# Patient Record
Sex: Male | Born: 1960 | Race: White | Hispanic: No | Marital: Married | Smoking: Never smoker
Health system: Southern US, Community
[De-identification: ages and names within clinical notes are randomized; demographics above are authoritative.]

## PROBLEM LIST (undated history)

## (undated) DIAGNOSIS — K7290 Hepatic failure, unspecified without coma: Secondary | ICD-10-CM

## (undated) DIAGNOSIS — N19 Unspecified kidney failure: Secondary | ICD-10-CM

## (undated) DIAGNOSIS — I10 Essential (primary) hypertension: Secondary | ICD-10-CM

## (undated) HISTORY — PX: OTHER SURGICAL HISTORY: SHX169

---

## 2007-12-26 ENCOUNTER — Observation Stay: Payer: Self-pay | Admitting: Internal Medicine

## 2013-03-09 ENCOUNTER — Other Ambulatory Visit: Payer: Self-pay | Admitting: Bariatrics

## 2013-03-09 DIAGNOSIS — I10 Essential (primary) hypertension: Secondary | ICD-10-CM

## 2013-03-14 ENCOUNTER — Other Ambulatory Visit: Payer: Self-pay

## 2016-12-16 ENCOUNTER — Encounter: Payer: Self-pay | Admitting: Intensive Care

## 2016-12-16 ENCOUNTER — Emergency Department: Payer: BC Managed Care – PPO

## 2016-12-16 ENCOUNTER — Observation Stay
Admission: EM | Admit: 2016-12-16 | Discharge: 2016-12-17 | Disposition: A | Discharge status: Home / Self Care | Payer: BC Managed Care – PPO | Attending: Internal Medicine | Admitting: Internal Medicine

## 2016-12-16 DIAGNOSIS — I10 Essential (primary) hypertension: Secondary | ICD-10-CM | POA: Insufficient documentation

## 2016-12-16 DIAGNOSIS — Z9884 Bariatric surgery status: Secondary | ICD-10-CM | POA: Insufficient documentation

## 2016-12-16 DIAGNOSIS — R079 Chest pain, unspecified: Secondary | ICD-10-CM | POA: Diagnosis present

## 2016-12-16 DIAGNOSIS — R0789 Other chest pain: Principal | ICD-10-CM | POA: Insufficient documentation

## 2016-12-16 DIAGNOSIS — R200 Anesthesia of skin: Secondary | ICD-10-CM | POA: Insufficient documentation

## 2016-12-16 DIAGNOSIS — R110 Nausea: Secondary | ICD-10-CM | POA: Diagnosis not present

## 2016-12-16 DIAGNOSIS — R42 Dizziness and giddiness: Secondary | ICD-10-CM | POA: Insufficient documentation

## 2016-12-16 HISTORY — DX: Essential (primary) hypertension: I10

## 2016-12-16 LAB — BASIC METABOLIC PANEL
Anion gap: 11 (ref 5–15)
BUN: 13 mg/dL (ref 6–20)
CO2: 25 mmol/L (ref 22–32)
CREATININE: 0.9 mg/dL (ref 0.61–1.24)
Calcium: 9.1 mg/dL (ref 8.9–10.3)
Chloride: 103 mmol/L (ref 101–111)
GFR calc Af Amer: >60 mL/min (ref >60)
Glucose, Bld: 139 mg/dL — ABNORMAL HIGH (ref 65–99)
Potassium: 3.6 mmol/L (ref 3.5–5.1)
SODIUM: 139 mmol/L (ref 135–145)

## 2016-12-16 LAB — CBC
HCT: 45.1 % (ref 40.0–52.0)
Hemoglobin: 15.4 g/dL (ref 13.0–18.0)
MCH: 33 pg (ref 26.0–34.0)
MCHC: 34.1 g/dL (ref 32.0–36.0)
MCV: 97 fL (ref 80.0–100.0)
PLATELETS: 199 10*3/uL (ref 150–440)
RBC: 4.65 MIL/uL (ref 4.40–5.90)
RDW: 13.6 % (ref 11.5–14.5)
WBC: 10.6 10*3/uL (ref 3.8–10.6)

## 2016-12-16 LAB — TROPONIN I: Troponin I: <0.03 ng/mL (ref <0.03)

## 2016-12-16 MED ORDER — ATORVASTATIN CALCIUM 20 MG PO TABS
40.0000 mg | ORAL_TABLET | Freq: Every day | ORAL | Status: DC
  Administered 2016-12-16: 40 mg via ORAL
  Filled 2016-12-16: qty 2

## 2016-12-16 MED ORDER — ONDANSETRON HCL 4 MG/2ML IJ SOLN: 4.0000 mg | Freq: Four times a day (QID) | INTRAMUSCULAR | Status: DC | PRN

## 2016-12-16 MED ORDER — MORPHINE SULFATE (PF) 2 MG/ML IV SOLN: 2.0000 mg | INTRAVENOUS | Status: DC | PRN

## 2016-12-16 MED ORDER — ACETAMINOPHEN 325 MG PO TABS: 650.0000 mg | ORAL_TABLET | ORAL | Status: DC | PRN

## 2016-12-16 MED ORDER — ASPIRIN 81 MG PO CHEW
324.0000 mg | CHEWABLE_TABLET | Freq: Once | ORAL | Status: AC
  Administered 2016-12-16: 324 mg via ORAL
  Filled 2016-12-16: qty 4

## 2016-12-16 MED ORDER — NITROGLYCERIN 0.4 MG SL SUBL
0.4000 mg | SUBLINGUAL_TABLET | SUBLINGUAL | Status: DC | PRN
  Administered 2016-12-16: 0.4 mg via SUBLINGUAL
  Filled 2016-12-16: qty 1

## 2016-12-16 MED ORDER — FLUTICASONE PROPIONATE 50 MCG/ACT NA SUSP
2.0000 | Freq: Every day | NASAL | Status: DC | PRN
  Filled 2016-12-16: qty 16

## 2016-12-16 MED ORDER — METOPROLOL TARTRATE 50 MG PO TABS
50.0000 mg | ORAL_TABLET | Freq: Two times a day (BID) | ORAL | Status: DC
  Administered 2016-12-16 – 2016-12-17 (×2): 50 mg via ORAL
  Filled 2016-12-16 (×2): qty 1

## 2016-12-16 MED ORDER — ASPIRIN EC 325 MG PO TBEC
325.0000 mg | DELAYED_RELEASE_TABLET | Freq: Every day | ORAL | Status: DC
  Administered 2016-12-17: 325 mg via ORAL
  Filled 2016-12-16: qty 1

## 2016-12-16 MED ORDER — SODIUM CHLORIDE 0.9 % IV BOLUS (SEPSIS)
500.0000 mL | Freq: Once | INTRAVENOUS | Status: AC
  Administered 2016-12-16: 500 mL via INTRAVENOUS

## 2016-12-16 MED ORDER — LORATADINE 10 MG PO TABS
10.0000 mg | ORAL_TABLET | Freq: Every day | ORAL | Status: DC
  Filled 2016-12-16: qty 1

## 2016-12-16 MED ORDER — GI COCKTAIL ~~LOC~~
30.0000 mL | Freq: Four times a day (QID) | ORAL | Status: DC | PRN
  Filled 2016-12-16: qty 30

## 2016-12-16 MED ORDER — LEVOCETIRIZINE DIHYDROCHLORIDE 5 MG PO TABS: 5.0000 mg | ORAL_TABLET | Freq: Every day | ORAL | Status: DC

## 2016-12-16 MED ORDER — ENOXAPARIN SODIUM 40 MG/0.4ML ~~LOC~~ SOLN
40.0000 mg | SUBCUTANEOUS | Status: DC
  Administered 2016-12-16: 40 mg via SUBCUTANEOUS
  Filled 2016-12-16: qty 0.4

## 2016-12-16 NOTE — Plan of Care (Signed)
Problem: Cardiac: Goal: Ability to achieve and maintain adequate cardiovascular perfusion will improve Stress test ordered for tomorrow

## 2016-12-16 NOTE — Plan of Care (Signed)
Problem: Safety: Goal: Ability to remain free from injury will improve Outcome: Progressing Pt moderate fall risk

## 2016-12-16 NOTE — ED Notes (Signed)
Dr. Chen at bedside.

## 2016-12-16 NOTE — Plan of Care (Signed)
Problem: Education: Goal: Knowledge of [facility] Education information/materials will improve Outcome: Progressing [place] packet given and reviewed with pt/ informed how to use call bell and phone/ white board updated with plan of care

## 2016-12-16 NOTE — H&P (Addendum)
[facility] - [place] at [facility]   PATIENT NAME: Melvin Stone    MR#:  [id number]  DATE OF BIRTH:  20-Feb-1961  DATE OF ADMISSION:  12/16/2016  PRIMARY CARE PHYSICIAN: Lauro Regulus., MD   REQUESTING/REFERRING PHYSICIAN: Nita Sickle, MD  CHIEF COMPLAINT:   Chief Complaint  Patient presents with  . Chest Pain   Chest pain today HISTORY OF PRESENT ILLNESS:  Rece Stone  is a 56 y.o. male with a known history of Hypertension. The patient presents to the ED with chest pain today. He was driving home from work when he developed chest pressure in the center of the chest, which is constant 5-6 out of 10, but associated with nausea and lightheadedness. He felt left arm numbness. He said he had a heart catheterization in [place] 10 years ago, which was unremarkable. His first troponin level is normal. He has abnormal EKG, but he said his previous EKG was abnormal.  He was given aspirin and nitroglycerin in the ED, chest pain resolved. He reported that he has stress from work recently.  PAST MEDICAL HISTORY:   Past Medical History:  Diagnosis Date  . Hypertension     PAST SURGICAL HISTORY:   Past Surgical History:  Procedure Laterality Date  . gastric bypass      SOCIAL HISTORY:   Social History  Substance Use Topics  . Smoking status: Never Smoker  . Smokeless tobacco: Never Used  . Alcohol use 2.4 oz/week    4 Cans of beer per week    FAMILY HISTORY:   Family History  Problem Relation Age of Onset  . Hypertension Mother     DRUG ALLERGIES:  No Known Allergies  REVIEW OF SYSTEMS:   Review of Systems  Constitutional: Negative for chills, fever and malaise/fatigue.  HENT: Negative for congestion and sore throat.   Eyes: Negative for blurred vision and double vision.  Respiratory: Negative for cough, shortness of breath and stridor.   Cardiovascular: Positive for chest pain. Negative for palpitations and leg swelling.    Gastrointestinal: Negative for abdominal pain, blood in stool, diarrhea, melena, nausea and vomiting.  Genitourinary: Negative for dysuria and hematuria.  Musculoskeletal: Negative for back pain.  Skin: Negative for itching and rash.  Neurological: Negative for dizziness, focal weakness, loss of consciousness, weakness and headaches.  Psychiatric/Behavioral: Negative for depression. The patient is nervous/anxious.     MEDICATIONS AT HOME:   Prior to Admission medications   Medication Sig Start Date End Date Taking? Authorizing Provider  fluticasone (FLONASE) 50 MCG/ACT nasal spray Place 2 sprays into both nostrils daily as needed. 10/19/16  Yes Historical Provider, MD  levocetirizine (XYZAL) 5 MG tablet Take 5 mg by mouth daily. 12/02/16  Yes Historical Provider, MD  metoprolol (LOPRESSOR) 50 MG tablet Take 50 mg by mouth 2 (two) times daily.  11/09/16  Yes Historical Provider, MD      VITAL SIGNS:  Blood pressure (!) 148/81, pulse 80, resp. rate 15, SpO2 97 %.  PHYSICAL EXAMINATION:  Physical Exam  GENERAL:  56 y.o.-year-old patient lying in the bed with no acute distress.  EYES: Pupils equal, round, reactive to light and accommodation. No scleral icterus. Extraocular muscles intact.  HEENT: Head atraumatic, normocephalic. Oropharynx and nasopharynx clear.  NECK:  Supple, no jugular venous distention. No thyroid enlargement, no tenderness.  LUNGS: Normal breath sounds bilaterally, no wheezing, rales,rhonchi or crepitation. No use of accessory muscles of respiration.  CARDIOVASCULAR: S1, S2 normal. No murmurs, rubs, or gallops.  ABDOMEN: Soft, nontender, nondistended. Bowel sounds present. No organomegaly or mass.  EXTREMITIES: No pedal edema, cyanosis, or clubbing.  NEUROLOGIC: Cranial nerves II through XII are intact. Muscle strength 5/5 in all extremities. Sensation intact. Gait not checked.  PSYCHIATRIC: The patient is alert and oriented x 3.  SKIN: No obvious rash, lesion, or  ulcer.   LABORATORY PANEL:   CBC  Recent Labs Lab 12/16/16 1432  WBC 10.6  HGB 15.4  HCT 45.1  PLT 199   ------------------------------------------------------------------------------------------------------------------  Chemistries   Recent Labs Lab 12/16/16 1432  NA 139  K 3.6  CL 103  CO2 25  GLUCOSE 139*  BUN 13  CREATININE 0.90  CALCIUM 9.1   ------------------------------------------------------------------------------------------------------------------  Cardiac Enzymes  Recent Labs Lab 12/16/16 1432  TROPONINI <0.03   ------------------------------------------------------------------------------------------------------------------  RADIOLOGY:  Dg Chest 2 View  Result Date: 12/16/2016 CLINICAL DATA:  Chest pressure while driving. Abnormal tension sensation in the left arm. History of hypertension. Nonsmoker. EXAM: CHEST  2 VIEW COMPARISON:  Chest x-ray of Dec 27, 2007 FINDINGS: The lungs are adequately inflated. The interstitial markings are coarse but this is not entirely new. The heart is normal in size. The pulmonary vascularity is not engorged. There is calcification in the wall of the aortic arch. There is no pleural effusion. The bony thorax exhibits no acute abnormality. IMPRESSION: No pneumonia nor CHF. Mild interstitial prominence bilaterally likely reflects chronic bronchitic change. Thoracic aortic atherosclerosis. Electronically Signed   By: David  Swazil[place]Jordan M.D.   On: 12/16/2016 15:41      IMPRESSION AND PLAN:   Chest pain, rule out ACS. The patient will be placed for observation. Continued telemetry monitor, aspirin, Lopressor and Lipitor, follow-up troponin level and stress test in a.m.  Hypertension. Continue Lopressor.  All the records are reviewed and case discussed with ED provider. Management plans discussed with the patient, his wife and they are in agreement.  CODE STATUS: Full code  TOTAL TIME TAKING CARE OF THIS PATIENT: 46  minutes.    Shaune Pollackhen, Adana Marik M.D on 12/16/2016 at 4:31 PM  Between 7am to 6pm - Pager - [phone]  After 6pm go to [email or web address] - Social research officer, [email or web address] [email or web address]  [facility] [place] [facility]  Office  [phone]  CC: Primary care physician; Lauro RegulusANDERSON,MARSHALL W., MD   Note: This dictation was prepared with Dragon dictation along with smaller phrase technology. Any transcriptional errors that result from this process are unintentional.

## 2016-12-16 NOTE — ED Notes (Signed)
Pt family brought pt food. Dr. Imogene Burnhen told pt he could eat until midnight.

## 2016-12-16 NOTE — ED Triage Notes (Signed)
Patient reports he was driving and started feeling chest pressure. Upon arrival to ER patient reports tension in his L arm. A&O x4

## 2016-12-16 NOTE — ED Provider Notes (Signed)
[facility] Emergency Department Provider Note  ____________________________________________  Time seen: Approximately 3:30 PM  I have reviewed the triage vital signs and the nursing notes.   HISTORY  Chief Complaint Chest Pain   HPI Melvin Stone is a 56 y.o. male with a history of hypertension and obesity who presents for evaluation of chest pain. Patient reports that he was driving home from work when he developed pressure in the center of his chest, constant, moderate intensity, non radiating, associated with nausea and lightheadedness, and radiating to the L arm. The pressure is currently 1/10. No nausea, diaphoresis, SOB, or vomiting. No prior episodes of CP. Patient reports that he walks 3-4 miles a day with no chest discomfort. He reports that he [place]. He denies personal or family history of ischemic heart disease. He is not a smoker. Pain does not radiate to the back, is not pleuritic in nature, no neurological deficits.  Past Medical History:  Diagnosis Date  . Hypertension     There are no active problems to display for this patient.   Past Surgical History:  Procedure Laterality Date  . gastric bypass      Prior to Admission medications   Medication Sig Start Date End Date Taking? Authorizing Provider  fluticasone (FLONASE) 50 MCG/ACT nasal spray Place 2 sprays into both nostrils daily as needed. 10/19/16  Yes Historical Provider, MD  levocetirizine (XYZAL) 5 MG tablet Take 5 mg by mouth daily. 12/02/16   Historical Provider, MD  metoprolol (LOPRESSOR) 50 MG tablet Take 50 mg by mouth daily. 11/09/16   Historical Provider, MD    Allergies Patient has no known allergies.  History reviewed. No pertinent family history.  Social History Social History  Substance Use Topics  . Smoking status: Never Smoker  . Smokeless tobacco: Never Used  . Alcohol use 2.4 oz/week    4  Cans of beer per week    Review of Systems  Constitutional: Negative for fever. Eyes: Negative for visual changes. ENT: Negative for sore throat. Neck: No neck pain  Cardiovascular: + chest pain. Respiratory: Negative for shortness of breath. Gastrointestinal: Negative for abdominal pain, vomiting or diarrhea. Genitourinary: Negative for dysuria. Musculoskeletal: Negative for back pain. Skin: Negative for rash. Neurological: Negative for headaches, weakness or numbness. Psych: No SI or HI  ____________________________________________   PHYSICAL EXAM:  VITAL SIGNS: Vitals:   12/16/16 1500 12/16/16 1536  BP: (!) 152/93 (!) 160/88  Pulse: 91 82  Resp:  15    Constitutional: Alert and oriented. Well appearing and in no apparent distress. HEENT:      Head: Normocephalic and atraumatic.         Eyes: Conjunctivae are normal. Sclera is non-icteric. EOMI. PERRL      Mouth/Throat: Mucous membranes are moist.       Neck: Supple with no signs of meningismus. Cardiovascular: Regular rate and rhythm. No murmurs, gallops, or rubs. 2+ symmetrical distal pulses are present in all extremities. No JVD. Respiratory: Normal respiratory effort. Lungs are clear to auscultation bilaterally. No wheezes, crackles, or rhonchi.  Gastrointestinal: Soft, non tender, and non distended with positive bowel sounds. No rebound or guarding. Genitourinary: No CVA tenderness. Musculoskeletal: Nontender with normal range of motion in all extremities. No edema, cyanosis, or erythema of extremities. Neurologic: Normal speech and language. Face is symmetric. Moving all extremities. No gross focal neurologic deficits are appreciated. Skin: Skin is warm, dry and  intact. No rash noted. Psychiatric: Mood and affect are normal. Speech and behavior are normal.  ____________________________________________   LABS (all labs ordered are listed, but only abnormal results are displayed)  Labs Reviewed  BASIC  METABOLIC PANEL - Abnormal; Notable for the following:       Result Value   Glucose, Bld 139 (*)    All other components within normal limits  CBC  TROPONIN I   ____________________________________________  EKG  ED ECG REPORT I, Nita Sickle, the attending physician, personally viewed and interpreted this ECG.  14:34 - normal sinus rhythm, rate of 99, normal intervals, normal axis, no ST elevations or depressions, Q waves on inferior leads.  15:14 - NSR, rate 86, normal intervals, normal axis, no ST elevations or depressions, T-wave inversion in 3 and aVF which are new when compared to the one earlier today. ____________________________________________  RADIOLOGY  CXR: No pneumonia nor CHF. Mild interstitial prominence bilaterally likely reflects chronic bronchitic change.  Thoracic aortic atherosclerosis.  ____________________________________________   PROCEDURES  Procedure(s) performed: None Procedures Critical Care performed:  None ____________________________________________   INITIAL IMPRESSION / ASSESSMENT AND PLAN / ED COURSE  56 y.o. male with a history of hypertension and obesity who presents for evaluation of chest pain. Patient's history is concerning for ACS, Heart score of 5, EKG with dynamic changes but no STEMI. Troponin x 1 negative. Pain resolved with one sublingual nitro. Will admit to Hospitalist.     Pertinent labs & imaging results that were available during my care of the patient were reviewed by me and considered in my medical decision making (see chart for details).    ____________________________________________   FINAL CLINICAL IMPRESSION(S) / ED DIAGNOSES  Final diagnoses:  Chest pain, unspecified type      NEW MEDICATIONS STARTED DURING THIS VISIT:  New Prescriptions   No medications on file     Note:  This document was prepared using Dragon voice recognition software and may include unintentional dictation errors.      Nita Sickle, MD 12/16/16 [phone]

## 2016-12-17 ENCOUNTER — Other Ambulatory Visit: Payer: BC Managed Care – PPO

## 2016-12-17 LAB — LIPID PANEL
Cholesterol: 172 mg/dL (ref 0–200)
HDL: 75 mg/dL (ref >40)
LDL CALC: 85 mg/dL (ref 0–99)
Total CHOL/HDL Ratio: 2.3 RATIO
Triglycerides: 60 mg/dL (ref <150)
VLDL: 12 mg/dL (ref 0–40)

## 2016-12-17 LAB — HIV ANTIBODY (ROUTINE TESTING W REFLEX): HIV Screen 4th Generation wRfx: NONREACTIVE

## 2016-12-17 NOTE — Progress Notes (Signed)
[facility] [place] at [facility]        Melvin Stone was admitted to the Hospital on 12/16/2016 and Discharged  12/17/2016 and should be excused from work/school   for 2  days starting 12/16/2016 , may return to work/school without any restrictions.  Call [name] MD with questions.  [name] M.D on 12/17/2016,at 10:27 AM  [facility] - [place] at [facility]    Office  [phone]

## 2016-12-17 NOTE — Care Management (Signed)
No discharge needs identified by members of the care team 

## 2016-12-17 NOTE — Discharge Summary (Addendum)
[facility] - [place] at [facility]   PATIENT NAME: Melvin Stone    MR#:  [id number]  DATE OF BIRTH:  Jan 04, 1961  DATE OF ADMISSION:  12/16/2016 ADMITTING PHYSICIAN: Shaune Pollack, MD  DATE OF DISCHARGE: 12/17/2016  PRIMARY CARE PHYSICIAN: Lauro Regulus., MD    ADMISSION DIAGNOSIS:  Chest pain [R07.9] Chest pain, unspecified type [R07.9]  DISCHARGE DIAGNOSIS:  Active Problems:   Chest pain   SECONDARY DIAGNOSIS:   Past Medical History:  Diagnosis Date  . Hypertension     HOSPITAL COURSE:   56 year old male with hypertension who presents with atypical chest pain.  1. Atypical chest pain: Patient had cardiac enzymes which were normal. Telemetry showed no irregular heart rhythm. Stress test was normal.  2. Essential hypertension: Patient's blood pressure was slightly elevated. Patient will need outpatient follow-up for blood pressure.   DISCHARGE CONDITIONS AND DIET:   Stable for discharge on heart healthy diet  CONSULTS OBTAINED:    DRUG ALLERGIES:  No Known Allergies  DISCHARGE MEDICATIONS:   Current Discharge Medication List    CONTINUE these medications which have NOT CHANGED   Details  fluticasone (FLONASE) 50 MCG/ACT nasal spray Place 2 sprays into both nostrils daily as needed.    levocetirizine (XYZAL) 5 MG tablet Take 5 mg by mouth daily.    metoprolol (LOPRESSOR) 50 MG tablet Take 50 mg by mouth 2 (two) times daily.     Multiple Vitamin (MULTIVITAMIN WITH MINERALS) TABS tablet Take 1 tablet by mouth daily.          Today   CHIEF COMPLAINT:   No acute events overnight. Patient reports no chest pain tremors   VITAL SIGNS:  Blood pressure (!) 169/84, pulse 60, temperature 97.6 F (36.4 C), temperature source Oral, resp. rate 14, weight 95 kg (209 lb 8 oz), SpO2 97 %.   REVIEW OF SYSTEMS:  Review of Systems  Constitutional: Negative.  Negative for chills, fever and malaise/fatigue.  HENT: Negative.  Negative for  ear discharge, ear pain, hearing loss, nosebleeds and sore throat.   Eyes: Negative.  Negative for blurred vision and pain.  Respiratory: Negative.  Negative for cough, hemoptysis, shortness of breath and wheezing.   Cardiovascular: Negative.  Negative for chest pain, palpitations and leg swelling.  Gastrointestinal: Negative.  Negative for abdominal pain, blood in stool, diarrhea, nausea and vomiting.  Genitourinary: Negative.  Negative for dysuria.  Musculoskeletal: Negative.  Negative for back pain.  Skin: Negative.   Neurological: Negative for dizziness, tremors, speech change, focal weakness, seizures and headaches.  Endo/Heme/Allergies: Negative.  Does not bruise/bleed easily.  Psychiatric/Behavioral: Negative.  Negative for depression, hallucinations and suicidal ideas.     PHYSICAL EXAMINATION:  GENERAL:  56 y.o.-year-old patient lying in the bed with no acute distress.  NECK:  Supple, no jugular venous distention. No thyroid enlargement, no tenderness.  LUNGS: Normal breath sounds bilaterally, no wheezing, rales,rhonchi  No use of accessory muscles of respiration.  CARDIOVASCULAR: S1, S2 normal. No murmurs, rubs, or gallops.  ABDOMEN: Soft, non-tender, non-distended. Bowel sounds present. No organomegaly or mass.  EXTREMITIES: No pedal edema, cyanosis, or clubbing.  PSYCHIATRIC: The patient is alert and oriented x 3.  SKIN: No obvious rash, lesion, or ulcer.   DATA REVIEW:   CBC  Recent Labs Lab 12/16/16 1432  WBC 10.6  HGB 15.4  HCT 45.1  PLT 199    Chemistries   Recent Labs Lab 12/16/16 1432  NA 139  K 3.6  CL 103  CO2 25  GLUCOSE 139*  BUN 13  CREATININE 0.90  CALCIUM 9.1    Cardiac Enzymes  Recent Labs Lab 12/16/16 1432 12/16/16 1827  TROPONINI <0.03 <0.03    Microbiology Results  @[email or web address] @  RADIOLOGY:  Dg Chest 2 View  Result Date: 12/16/2016 CLINICAL DATA:  Chest pressure while driving. Abnormal tension sensation in the left arm.  History of hypertension. Nonsmoker. EXAM: CHEST  2 VIEW COMPARISON:  Chest x-ray of Dec 27, 2007 FINDINGS: The lungs are adequately inflated. The interstitial markings are coarse but this is not entirely new. The heart is normal in size. The pulmonary vascularity is not engorged. There is calcification in the wall of the aortic arch. There is no pleural effusion. The bony thorax exhibits no acute abnormality. IMPRESSION: No pneumonia nor CHF. Mild interstitial prominence bilaterally likely reflects chronic bronchitic change. Thoracic aortic atherosclerosis. Electronically Signed   By: David  Swazil[place]Jordan M.D.   On: 12/16/2016 15:41      Current Discharge Medication List    CONTINUE these medications which have NOT CHANGED   Details  fluticasone (FLONASE) 50 MCG/ACT nasal spray Place 2 sprays into both nostrils daily as needed.    levocetirizine (XYZAL) 5 MG tablet Take 5 mg by mouth daily.    metoprolol (LOPRESSOR) 50 MG tablet Take 50 mg by mouth 2 (two) times daily.     Multiple Vitamin (MULTIVITAMIN WITH MINERALS) TABS tablet Take 1 tablet by mouth daily.           Management plans discussed with the patient and he is in agreement. Stable for discharge home  Patient should follow up with pcp  CODE STATUS:     Code Status Orders        Start     Ordered   12/16/16 1639  Full code  Continuous     12/16/16 1638    Code Status History    Date Active Date Inactive Code Status Order ID Comments User Context   This patient has a current code status but no historical code status.      TOTAL TIME TAKING CARE OF THIS PATIENT: 37 minutes.    Note: This dictation was prepared with Dragon dictation along with smaller phrase technology. Any transcriptional errors that result from this process are unintentional.  Kamori Barbier M.D on 12/17/2016 at 10:25 AM  Between 7am to 6pm - Pager - [phone] After 6pm go to [email or web address] - password [email or web address]  [facility]   Office  [phone]  CC: Primary care physician; Lauro RegulusANDERSON,MARSHALL W., MD

## 2016-12-17 NOTE — Plan of Care (Signed)
Problem: Health Behavior/Discharge Planning: Goal: Ability to manage health-related needs will improve Outcome: Completed/Met Date Met: 12/17/16 Discharge instructions and meds and followup

## 2019-01-19 ENCOUNTER — Other Ambulatory Visit: Payer: Self-pay | Admitting: Internal Medicine

## 2019-01-19 ENCOUNTER — Ambulatory Visit
Admission: RE | Admit: 2019-01-19 | Discharge: 2019-01-19 | Disposition: A | Discharge status: Home / Self Care | Payer: BC Managed Care – PPO | Source: Ambulatory Visit | Attending: Internal Medicine | Admitting: Internal Medicine

## 2019-01-19 ENCOUNTER — Other Ambulatory Visit: Payer: Self-pay

## 2019-01-19 DIAGNOSIS — R270 Ataxia, unspecified: Secondary | ICD-10-CM

## 2019-01-22 ENCOUNTER — Other Ambulatory Visit: Payer: Self-pay | Admitting: Internal Medicine

## 2019-01-22 DIAGNOSIS — R7989 Other specified abnormal findings of blood chemistry: Secondary | ICD-10-CM

## 2019-01-23 ENCOUNTER — Ambulatory Visit: Payer: BC Managed Care – PPO

## 2019-01-26 ENCOUNTER — Other Ambulatory Visit: Payer: Self-pay | Admitting: Neurology

## 2019-01-26 ENCOUNTER — Other Ambulatory Visit (HOSPITAL_COMMUNITY): Payer: Self-pay | Admitting: Neurology

## 2019-01-26 DIAGNOSIS — R2689 Other abnormalities of gait and mobility: Secondary | ICD-10-CM

## 2019-01-26 DIAGNOSIS — R251 Tremor, unspecified: Secondary | ICD-10-CM

## 2019-01-30 ENCOUNTER — Emergency Department: Payer: BC Managed Care – PPO

## 2019-01-30 ENCOUNTER — Emergency Department
Admission: EM | Admit: 2019-01-30 | Discharge: 2019-01-30 | Disposition: A | Discharge status: Home / Self Care | Payer: BC Managed Care – PPO | Attending: Emergency Medicine | Admitting: Emergency Medicine

## 2019-01-30 ENCOUNTER — Other Ambulatory Visit: Payer: Self-pay

## 2019-01-30 DIAGNOSIS — R531 Weakness: Secondary | ICD-10-CM | POA: Diagnosis not present

## 2019-01-30 DIAGNOSIS — I10 Essential (primary) hypertension: Secondary | ICD-10-CM | POA: Diagnosis not present

## 2019-01-30 DIAGNOSIS — R251 Tremor, unspecified: Secondary | ICD-10-CM | POA: Insufficient documentation

## 2019-01-30 DIAGNOSIS — Z79899 Other long term (current) drug therapy: Secondary | ICD-10-CM | POA: Insufficient documentation

## 2019-01-30 DIAGNOSIS — R109 Unspecified abdominal pain: Secondary | ICD-10-CM

## 2019-01-30 LAB — HEPATIC FUNCTION PANEL
ALT: 129 U/L — ABNORMAL HIGH (ref 0–44)
AST: 256 U/L — ABNORMAL HIGH (ref 15–41)
Albumin: 3 g/dL — ABNORMAL LOW (ref 3.5–5.0)
Alkaline Phosphatase: 156 U/L — ABNORMAL HIGH (ref 38–126)
Bilirubin, Direct: 1.7 mg/dL — ABNORMAL HIGH (ref 0.0–0.2)
Indirect Bilirubin: 1.5 mg/dL — ABNORMAL HIGH (ref 0.3–0.9)
Total Bilirubin: 3.2 mg/dL — ABNORMAL HIGH (ref 0.3–1.2)
Total Protein: 6.3 g/dL — ABNORMAL LOW (ref 6.5–8.1)

## 2019-01-30 LAB — CBC
HCT: 37.3 % — ABNORMAL LOW (ref 39.0–52.0)
Hemoglobin: 13.2 g/dL (ref 13.0–17.0)
MCH: 34.5 pg — ABNORMAL HIGH (ref 26.0–34.0)
MCHC: 35.4 g/dL (ref 30.0–36.0)
MCV: 97.4 fL (ref 80.0–100.0)
Platelets: 94 10*3/uL — ABNORMAL LOW (ref 150–400)
RBC: 3.83 MIL/uL — ABNORMAL LOW (ref 4.22–5.81)
RDW: 14.4 % (ref 11.5–15.5)
WBC: 4 10*3/uL (ref 4.0–10.5)
nRBC: 0 % (ref 0.0–0.2)

## 2019-01-30 LAB — DIFFERENTIAL
Abs Immature Granulocytes: 0.01 10*3/uL (ref 0.00–0.07)
Basophils Absolute: 0 10*3/uL (ref 0.0–0.1)
Basophils Relative: 1 %
Eosinophils Absolute: 0 10*3/uL (ref 0.0–0.5)
Eosinophils Relative: 1 %
Immature Granulocytes: 0 %
Lymphocytes Relative: 35 %
Lymphs Abs: 1.4 10*3/uL (ref 0.7–4.0)
Monocytes Absolute: 0.6 10*3/uL (ref 0.1–1.0)
Monocytes Relative: 14 %
Neutro Abs: 2 10*3/uL (ref 1.7–7.7)
Neutrophils Relative %: 49 %

## 2019-01-30 LAB — BASIC METABOLIC PANEL
Anion gap: 16 — ABNORMAL HIGH (ref 5–15)
BUN: 7 mg/dL (ref 6–20)
CO2: 21 mmol/L — ABNORMAL LOW (ref 22–32)
Calcium: 8.1 mg/dL — ABNORMAL LOW (ref 8.9–10.3)
Chloride: 103 mmol/L (ref 98–111)
Creatinine, Ser: 0.62 mg/dL (ref 0.61–1.24)
GFR calc Af Amer: >60 mL/min (ref >60)
GFR calc non Af Amer: >60 mL/min (ref >60)
Glucose, Bld: 91 mg/dL (ref 70–99)
Potassium: 3.9 mmol/L (ref 3.5–5.1)
Sodium: 140 mmol/L (ref 135–145)

## 2019-01-30 MED ORDER — ONDANSETRON HCL 4 MG/2ML IJ SOLN
4.0000 mg | Freq: Once | INTRAMUSCULAR | Status: AC
  Administered 2019-01-30: 4 mg via INTRAVENOUS
  Filled 2019-01-30: qty 2

## 2019-01-30 MED ORDER — LORAZEPAM 2 MG/ML IJ SOLN
1.0000 mg | Freq: Once | INTRAMUSCULAR | Status: AC
  Administered 2019-01-30: 1 mg via INTRAVENOUS
  Filled 2019-01-30: qty 1

## 2019-01-30 MED ORDER — SODIUM CHLORIDE 0.9% FLUSH
3.0000 mL | Freq: Once | INTRAVENOUS | Status: AC
  Administered 2019-01-30: 3 mL via INTRAVENOUS

## 2019-01-30 NOTE — ED Triage Notes (Signed)
Pt comes into the ED via EMS from home with c/o increased tremors and feeling off balance today,. States he has been having this issue for the past 3 weeks and has been seeing his PCP Dr. Ouida Sills with no cause or relief from his sx.

## 2019-01-30 NOTE — Discharge Instructions (Addendum)
Please follow-up with your neurologist and your primary care doctor.  Tell them you had the MRI and ultrasound.  They will be able to take it from there.  Please return for any worsening.

## 2019-01-30 NOTE — ED Provider Notes (Addendum)
[facility] Emergency Department Provider Note   ____________________________________________   First MD Initiated Contact with Patient 01/30/19 1726     (approximate)  I have reviewed the triage vital signs and the nursing notes.   HISTORY  Chief Complaint Tremors and Weakness    HPI Melvin Stone is a 58 y.o. male who is being seen by Dr. Dareen PianoAnderson and Dr. Sherryll BurgerShah for tremors.  He is unsteady on his feet and very shaky.  Review of his old records shows he had some head trauma about 3 weeks ago.  He also has A. fib.  He is supposed to have an MRI outpatient and an ultrasound.  We will see if we can get those now.  He comes in today because he is getting Librarian, academicshakier and shakier.         Past Medical History:  Diagnosis Date  . Hypertension     Patient Active Problem List   Diagnosis Date Noted  . Chest pain 12/16/2016    Past Surgical History:  Procedure Laterality Date  . gastric bypass      Prior to Admission medications   Medication Sig Start Date End Date Taking? Authorizing Provider  fluticasone (FLONASE) 50 MCG/ACT nasal spray Place 2 sprays into both nostrils daily as needed. 10/19/16   [provider]  levocetirizine (XYZAL) 5 MG tablet Take 5 mg by mouth daily. 12/02/16   [provider]  metoprolol (LOPRESSOR) 50 MG tablet Take 50 mg by mouth 2 (two) times daily.  11/09/16   [provider]  Multiple Vitamin (MULTIVITAMIN WITH MINERALS) TABS tablet Take 1 tablet by mouth daily.    [provider]    Allergies Patient has no known allergies.  Family History  Problem Relation Age of Onset  . Hypertension Mother     Social History Social History   Tobacco Use  . Smoking status: Never Smoker  . Smokeless tobacco: Never Used  Substance Use Topics  . Alcohol use: Yes    Alcohol/week: 4.0 standard drinks    Types: 4 Cans of beer per week  . Drug use: No    Review of Systems  Constitutional:  No fever/chills Eyes: No visual changes. ENT: No sore throat. Cardiovascular: Denies chest pain. Respiratory: Denies shortness of breath. Gastrointestinal: No abdominal pain.  No nausea, no vomiting.  No diarrhea.  No constipation. Genitourinary: Negative for dysuria. Musculoskeletal: Negative for back pain. Skin: Negative for rash. Neurological: Negative for headaches, focal weakness  ____________________________________________   PHYSICAL EXAM:  VITAL SIGNS: ED Triage Vitals  Enc Vitals Group     BP 01/30/19 1720 128/73     Pulse Rate 01/30/19 1720 (!) 105     Resp 01/30/19 1720 18     Temp 01/30/19 1720 98.5 F (36.9 C)     Temp Source 01/30/19 1720 Oral     SpO2 01/30/19 1720 99 %     Weight 01/30/19 1722 187 lb (84.8 kg)     Height 01/30/19 1722 5\' 4"  (1.626 m)     Head Circumference --      Peak Flow --      Pain Score 01/30/19 1721 0     Pain Loc --      Pain Edu? --      Excl. in GC? --     Constitutional: Alert and oriented. Well appearing and in no acute distress. Eyes: Conjunctivae are normal. Head: Atraumatic. Nose: No congestion/rhinnorhea. Mouth/Throat: Mucous membranes are moist.  Oropharynx  non-erythematous. Neck: No stridor.  Cardiovascular: Normal rate, regular rhythm. Grossly normal heart sounds.  Good peripheral circulation. Respiratory: Normal respiratory effort.  No retractions. Lungs CTAB. Gastrointestinal: Soft and nontender. No distention. No abdominal bruits. No CVA tenderness. Musculoskeletal: No lower extremity tenderness nor edema.   Neurologic:  Normal speech and language. No gross focal neurologic deficits are appreciated except for diffuse tremor that is both resting and intention. Skin:  Skin is warm, dry and intact. No rash noted.   ____________________________________________   LABS (all labs ordered are listed, but only abnormal results are displayed)  Labs Reviewed  BASIC METABOLIC PANEL - Abnormal; Notable for the following  components:      Result Value   CO2 21 (*)    Calcium 8.1 (*)    Anion gap 16 (*)    All other components within normal limits  CBC - Abnormal; Notable for the following components:   RBC 3.83 (*)    HCT 37.3 (*)    MCH 34.5 (*)    Platelets 94 (*)    All other components within normal limits  HEPATIC FUNCTION PANEL - Abnormal; Notable for the following components:   Total Protein 6.3 (*)    Albumin 3.0 (*)    AST 256 (*)    ALT 129 (*)    Alkaline Phosphatase 156 (*)    Total Bilirubin 3.2 (*)    Bilirubin, Direct 1.7 (*)    Indirect Bilirubin 1.5 (*)    All other components within normal limits  DIFFERENTIAL  URINALYSIS, COMPLETE (UACMP) WITH MICROSCOPIC  FOLATE RBC  VITAMIN B12  CBG MONITORING, ED   ____________________________________________  EKG EKG read interpreted by me shows a flutter at 102 normal axis no acute changes there are flipped T's inferiorly but these were present on EKG done 12/16/2016.  ____________________________________________  RADIOLOGY  ED MD interpretation: X-rays of the eye showed no evidence of metallic foreign body.  MRI of the brain shows no acute problems.  Ultrasound shows sludge and small gallstones but no cholecystitis.  Official radiology report(s): Dg Eye Foreign Body  Result Date: 01/30/2019 CLINICAL DATA:  Metal working/exposure; clearance prior to MRI EXAM: ORBITS FOR FOREIGN BODY - 2 VIEW COMPARISON:  None. FINDINGS: There is no evidence of metallic foreign body within the orbits. No significant bone abnormality identified. IMPRESSION: No evidence of metallic foreign body within the orbits. Electronically Signed   By: Deatra RobinsonKevin  Herman M.D.   On: 01/30/2019 18:37   Mr Brain Wo Contrast  Result Date: 01/30/2019 CLINICAL DATA:  Tremors and balance disturbance. Recent fall with head trauma. EXAM: MRI HEAD WITHOUT CONTRAST TECHNIQUE: Multiplanar, multiecho pulse sequences of the brain and surrounding structures were obtained without  intravenous contrast. COMPARISON:  None. FINDINGS: BRAIN: There is no acute infarct, acute hemorrhage or extra-axial collection. The midline structures are normal. No midline shift or other mass effect. Minimal white matter hyperintensity, nonspecific and commonly seen in asymptomatic patients of this age. The cerebral and cerebellar volume are age-appropriate. No hydrocephalus. Susceptibility-sensitive sequences show no chronic microhemorrhage or superficial siderosis. No mass lesion. VASCULAR: The major intracranial arterial and venous sinus flow voids are normal. SKULL AND UPPER CERVICAL SPINE: Calvarial bone marrow signal is normal. There is no skull base mass. Visualized upper cervical spine and soft tissues are normal. SINUSES/ORBITS: No fluid levels or advanced mucosal thickening. No mastoid or middle ear effusion. The orbits are normal. IMPRESSION: No acute intracranial abnormality.  Normal aging brain. Electronically Signed   By: Caryn BeeKevin  Collins Scotland M.D.   On: 01/30/2019 19:16   US Abdomen Limited Ruq  Result Date: 01/30/2019 CLINICAL DATA:  Abdominal pain. EXAM: ULTRASOUND ABDOMEN LIMITED RIGHT UPPER QUADRANT COMPARISON:  Abdominal ultrasound dated Dec 27, 2007. FINDINGS: Gallbladder: Sludge and small stones. No wall thickening visualized. No sonographic Murphy sign noted by sonographer. Common bile duct: Diameter: 3 mm, normal. Liver: No focal lesion identified. Increased in parenchymal echogenicity. Portal vein is patent on color Doppler imaging with normal direction of blood flow towards the liver. IMPRESSION: 1. Cholelithiasis and sludge without evidence of acute cholecystitis. 2. Hepatic steatosis. Electronically Signed   By: Titus Dubin M.D.   On: 01/30/2019 19:50    ____________________________________________   PROCEDURES  Procedure(s) performed (including Critical Care):  Procedures   ____________________________________________   INITIAL IMPRESSION / ASSESSMENT AND PLAN / ED  COURSE  Patient with tremor.  No apparent cause on the MRI.  Ultrasound shows some sludge but no cholecystitis.  Patient does have elevated LFTs but no abdominal tenderness and is a drinker.  I will have him follow-up with his primary care doctor and his neurologist.  He can return for any further problems.            ____________________________________________   FINAL CLINICAL IMPRESSION(S) / ED DIAGNOSES  Final diagnoses:  Tremors of nervous system     ED Discharge Orders    None       Note:  This document was prepared using Dragon voice recognition software and may include unintentional dictation errors.    Nena Polio, MD 01/30/19 2225    Nena Polio, MD 02/05/19 [phone]

## 2019-01-30 NOTE — ED Notes (Signed)
Pt vomiting in MRI , pt given meds that were ordered

## 2019-01-30 NOTE — ED Notes (Signed)
Pt states he has been having issues with memory, tremors and balance since tripping and a tool box at home fell on him causing him to hit his head at home and has been to PCP and neurologist since the onset of sx.

## 2019-01-31 LAB — VITAMIN B12: Vitamin B-12: 3592 pg/mL — ABNORMAL HIGH (ref 180–914)

## 2019-02-01 LAB — FOLATE RBC
Folate, Hemolysate: 267 ng/mL
Folate, RBC: 615 ng/mL (ref >498)
Hematocrit: 43.4 % (ref 37.5–51.0)

## 2019-02-12 ENCOUNTER — Ambulatory Visit: Admission: RE | Admit: 2019-02-12 | Payer: BC Managed Care – PPO | Source: Ambulatory Visit

## 2019-05-15 ENCOUNTER — Other Ambulatory Visit: Payer: Self-pay

## 2019-05-15 ENCOUNTER — Emergency Department: Payer: BC Managed Care – PPO

## 2019-05-15 ENCOUNTER — Inpatient Hospital Stay: Payer: BC Managed Care – PPO

## 2019-05-15 ENCOUNTER — Inpatient Hospital Stay
Admission: EM | Admit: 2019-05-15 | Discharge: 2019-05-18 | DRG: 432 | Disposition: A | Discharge status: Other Acute Inpatient Hospital | Payer: BC Managed Care – PPO | Attending: Internal Medicine | Admitting: Internal Medicine

## 2019-05-15 DIAGNOSIS — Z8249 Family history of ischemic heart disease and other diseases of the circulatory system: Secondary | ICD-10-CM | POA: Diagnosis not present

## 2019-05-15 DIAGNOSIS — Z8639 Personal history of other endocrine, nutritional and metabolic disease: Secondary | ICD-10-CM

## 2019-05-15 DIAGNOSIS — R140 Abdominal distension (gaseous): Secondary | ICD-10-CM

## 2019-05-15 DIAGNOSIS — Z515 Encounter for palliative care: Secondary | ICD-10-CM | POA: Diagnosis not present

## 2019-05-15 DIAGNOSIS — K7031 Alcoholic cirrhosis of liver with ascites: Secondary | ICD-10-CM | POA: Diagnosis present

## 2019-05-15 DIAGNOSIS — F1020 Alcohol dependence, uncomplicated: Secondary | ICD-10-CM | POA: Diagnosis present

## 2019-05-15 DIAGNOSIS — D689 Coagulation defect, unspecified: Secondary | ICD-10-CM | POA: Diagnosis present

## 2019-05-15 DIAGNOSIS — K7040 Alcoholic hepatic failure without coma: Secondary | ICD-10-CM | POA: Diagnosis present

## 2019-05-15 DIAGNOSIS — N179 Acute kidney failure, unspecified: Secondary | ICD-10-CM | POA: Diagnosis present

## 2019-05-15 DIAGNOSIS — E669 Obesity, unspecified: Secondary | ICD-10-CM | POA: Diagnosis present

## 2019-05-15 DIAGNOSIS — R531 Weakness: Secondary | ICD-10-CM

## 2019-05-15 DIAGNOSIS — R188 Other ascites: Secondary | ICD-10-CM

## 2019-05-15 DIAGNOSIS — D6489 Other specified anemias: Secondary | ICD-10-CM | POA: Diagnosis not present

## 2019-05-15 DIAGNOSIS — E876 Hypokalemia: Secondary | ICD-10-CM | POA: Diagnosis present

## 2019-05-15 DIAGNOSIS — K7200 Acute and subacute hepatic failure without coma: Secondary | ICD-10-CM | POA: Diagnosis not present

## 2019-05-15 DIAGNOSIS — E8881 Metabolic syndrome: Secondary | ICD-10-CM | POA: Diagnosis present

## 2019-05-15 DIAGNOSIS — I480 Paroxysmal atrial fibrillation: Secondary | ICD-10-CM | POA: Diagnosis present

## 2019-05-15 DIAGNOSIS — E871 Hypo-osmolality and hyponatremia: Secondary | ICD-10-CM | POA: Diagnosis present

## 2019-05-15 DIAGNOSIS — R059 Cough, unspecified: Secondary | ICD-10-CM

## 2019-05-15 DIAGNOSIS — Z6836 Body mass index (BMI) 36.0-36.9, adult: Secondary | ICD-10-CM

## 2019-05-15 DIAGNOSIS — K7011 Alcoholic hepatitis with ascites: Secondary | ICD-10-CM

## 2019-05-15 DIAGNOSIS — K766 Portal hypertension: Secondary | ICD-10-CM | POA: Diagnosis present

## 2019-05-15 DIAGNOSIS — R1011 Right upper quadrant pain: Secondary | ICD-10-CM

## 2019-05-15 DIAGNOSIS — Z20828 Contact with and (suspected) exposure to other viral communicable diseases: Secondary | ICD-10-CM | POA: Diagnosis present

## 2019-05-15 DIAGNOSIS — R05 Cough: Secondary | ICD-10-CM

## 2019-05-15 DIAGNOSIS — R17 Unspecified jaundice: Secondary | ICD-10-CM | POA: Diagnosis not present

## 2019-05-15 DIAGNOSIS — I10 Essential (primary) hypertension: Secondary | ICD-10-CM | POA: Diagnosis present

## 2019-05-15 DIAGNOSIS — E872 Acidosis: Secondary | ICD-10-CM | POA: Diagnosis not present

## 2019-05-15 DIAGNOSIS — Z9884 Bariatric surgery status: Secondary | ICD-10-CM | POA: Diagnosis not present

## 2019-05-15 DIAGNOSIS — K8020 Calculus of gallbladder without cholecystitis without obstruction: Secondary | ICD-10-CM

## 2019-05-15 DIAGNOSIS — Z7189 Other specified counseling: Secondary | ICD-10-CM | POA: Diagnosis not present

## 2019-05-15 DIAGNOSIS — R350 Frequency of micturition: Secondary | ICD-10-CM | POA: Diagnosis present

## 2019-05-15 DIAGNOSIS — Z79899 Other long term (current) drug therapy: Secondary | ICD-10-CM

## 2019-05-15 DIAGNOSIS — K767 Hepatorenal syndrome: Secondary | ICD-10-CM | POA: Diagnosis present

## 2019-05-15 DIAGNOSIS — R160 Hepatomegaly, not elsewhere classified: Secondary | ICD-10-CM

## 2019-05-15 LAB — COMPREHENSIVE METABOLIC PANEL
ALT: 46 U/L — ABNORMAL HIGH (ref 0–44)
ALT: 48 U/L — ABNORMAL HIGH (ref 0–44)
AST: 119 U/L — ABNORMAL HIGH (ref 15–41)
AST: 120 U/L — ABNORMAL HIGH (ref 15–41)
Albumin: 1.6 g/dL — ABNORMAL LOW (ref 3.5–5.0)
Albumin: 1.5 g/dL — ABNORMAL LOW (ref 3.5–5.0)
Alkaline Phosphatase: 134 U/L — ABNORMAL HIGH (ref 38–126)
Alkaline Phosphatase: 128 U/L — ABNORMAL HIGH (ref 38–126)
Anion gap: 15 (ref 5–15)
Anion gap: 15 (ref 5–15)
BUN: 61 mg/dL — ABNORMAL HIGH (ref 6–20)
BUN: 64 mg/dL — ABNORMAL HIGH (ref 6–20)
CO2: 21 mmol/L — ABNORMAL LOW (ref 22–32)
CO2: 21 mmol/L — ABNORMAL LOW (ref 22–32)
Calcium: 7.9 mg/dL — ABNORMAL LOW (ref 8.9–10.3)
Calcium: 7.8 mg/dL — ABNORMAL LOW (ref 8.9–10.3)
Chloride: 96 mmol/L — ABNORMAL LOW (ref 98–111)
Chloride: 96 mmol/L — ABNORMAL LOW (ref 98–111)
Creatinine, Ser: 1.88 mg/dL — ABNORMAL HIGH (ref 0.61–1.24)
Creatinine, Ser: 1.96 mg/dL — ABNORMAL HIGH (ref 0.61–1.24)
GFR calc Af Amer: 45 mL/min — ABNORMAL LOW (ref >60)
GFR calc Af Amer: 42 mL/min — ABNORMAL LOW (ref >60)
GFR calc non Af Amer: 39 mL/min — ABNORMAL LOW (ref >60)
GFR calc non Af Amer: 37 mL/min — ABNORMAL LOW (ref >60)
Glucose, Bld: 84 mg/dL (ref 70–99)
Glucose, Bld: 78 mg/dL (ref 70–99)
Potassium: 3.4 mmol/L — ABNORMAL LOW (ref 3.5–5.1)
Potassium: 3.4 mmol/L — ABNORMAL LOW (ref 3.5–5.1)
Sodium: 132 mmol/L — ABNORMAL LOW (ref 135–145)
Sodium: 132 mmol/L — ABNORMAL LOW (ref 135–145)
Total Bilirubin: 17.7 mg/dL — ABNORMAL HIGH (ref 0.3–1.2)
Total Bilirubin: 17.4 mg/dL — ABNORMAL HIGH (ref 0.3–1.2)
Total Protein: 6.9 g/dL (ref 6.5–8.1)
Total Protein: 6.8 g/dL (ref 6.5–8.1)

## 2019-05-15 LAB — BODY FLUID CELL COUNT WITH DIFFERENTIAL
Eos, Fluid: 0 %
Lymphs, Fluid: 19 %
Monocyte-Macrophage-Serous Fluid: 44 %
Neutrophil Count, Fluid: 37 %
Other Cells, Fluid: 0 %
Total Nucleated Cell Count, Fluid: 78 cu mm

## 2019-05-15 LAB — CBC WITH DIFFERENTIAL/PLATELET
Abs Immature Granulocytes: 0.11 10*3/uL — ABNORMAL HIGH (ref 0.00–0.07)
Basophils Absolute: 0.1 10*3/uL (ref 0.0–0.1)
Basophils Relative: 0 %
Eosinophils Absolute: 0.2 10*3/uL (ref 0.0–0.5)
Eosinophils Relative: 1 %
HCT: 37.6 % — ABNORMAL LOW (ref 39.0–52.0)
Hemoglobin: 13.3 g/dL (ref 13.0–17.0)
Immature Granulocytes: 1 %
Lymphocytes Relative: 15 %
Lymphs Abs: 2.2 10*3/uL (ref 0.7–4.0)
MCH: 35.1 pg — ABNORMAL HIGH (ref 26.0–34.0)
MCHC: 35.4 g/dL (ref 30.0–36.0)
MCV: 99.2 fL (ref 80.0–100.0)
Monocytes Absolute: 1.3 10*3/uL — ABNORMAL HIGH (ref 0.1–1.0)
Monocytes Relative: 9 %
Neutro Abs: 10.4 10*3/uL — ABNORMAL HIGH (ref 1.7–7.7)
Neutrophils Relative %: 74 %
Platelets: 177 10*3/uL (ref 150–400)
RBC: 3.79 MIL/uL — ABNORMAL LOW (ref 4.22–5.81)
RDW: 14.2 % (ref 11.5–15.5)
WBC: 14.2 10*3/uL — ABNORMAL HIGH (ref 4.0–10.5)
nRBC: 0 % (ref 0.0–0.2)

## 2019-05-15 LAB — CBC
HCT: 36.7 % — ABNORMAL LOW (ref 39.0–52.0)
Hemoglobin: 13.2 g/dL (ref 13.0–17.0)
MCH: 34.6 pg — ABNORMAL HIGH (ref 26.0–34.0)
MCHC: 36 g/dL (ref 30.0–36.0)
MCV: 96.3 fL (ref 80.0–100.0)
Platelets: 160 10*3/uL (ref 150–400)
RBC: 3.81 MIL/uL — ABNORMAL LOW (ref 4.22–5.81)
RDW: 13.9 % (ref 11.5–15.5)
WBC: 13.9 10*3/uL — ABNORMAL HIGH (ref 4.0–10.5)
nRBC: 0 % (ref 0.0–0.2)

## 2019-05-15 LAB — HEPATIC FUNCTION PANEL
ALT: 43 U/L (ref 0–44)
AST: 116 U/L — ABNORMAL HIGH (ref 15–41)
Albumin: 1.3 g/dL — ABNORMAL LOW (ref 3.5–5.0)
Alkaline Phosphatase: 118 U/L (ref 38–126)
Bilirubin, Direct: 8.8 mg/dL — ABNORMAL HIGH (ref 0.0–0.2)
Indirect Bilirubin: 6.7 mg/dL — ABNORMAL HIGH (ref 0.3–0.9)
Total Bilirubin: 15.5 mg/dL — ABNORMAL HIGH (ref 0.3–1.2)
Total Protein: 6.2 g/dL — ABNORMAL LOW (ref 6.5–8.1)

## 2019-05-15 LAB — PROTIME-INR
INR: 1.8 — ABNORMAL HIGH (ref 0.8–1.2)
Prothrombin Time: 20.9 seconds — ABNORMAL HIGH (ref 11.4–15.2)

## 2019-05-15 LAB — URINALYSIS, COMPLETE (UACMP) WITH MICROSCOPIC
Bacteria, UA: NONE SEEN
Specific Gravity, Urine: 1.018 (ref 1.005–1.030)

## 2019-05-15 LAB — URINE DRUG SCREEN, QUALITATIVE (ARMC ONLY)
Amphetamines, Ur Screen: NOT DETECTED
Barbiturates, Ur Screen: NOT DETECTED
Benzodiazepine, Ur Scrn: NOT DETECTED
Cannabinoid 50 Ng, Ur ~~LOC~~: NOT DETECTED
Cocaine Metabolite,Ur ~~LOC~~: NOT DETECTED
MDMA (Ecstasy)Ur Screen: NOT DETECTED
Methadone Scn, Ur: NOT DETECTED
Opiate, Ur Screen: NOT DETECTED
Phencyclidine (PCP) Ur S: NOT DETECTED
Tricyclic, Ur Screen: NOT DETECTED

## 2019-05-15 LAB — PHOSPHORUS: Phosphorus: 4.4 mg/dL (ref 2.5–4.6)

## 2019-05-15 LAB — PATHOLOGIST SMEAR REVIEW

## 2019-05-15 LAB — AMMONIA: Ammonia: 66 umol/L — ABNORMAL HIGH (ref 9–35)

## 2019-05-15 LAB — TROPONIN I (HIGH SENSITIVITY)
Troponin I (High Sensitivity): 7 ng/L (ref <18)
Troponin I (High Sensitivity): 8 ng/L (ref <18)

## 2019-05-15 LAB — MAGNESIUM: Magnesium: 1.8 mg/dL (ref 1.7–2.4)

## 2019-05-15 LAB — SARS CORONAVIRUS 2 BY RT PCR (HOSPITAL ORDER, PERFORMED IN ~~LOC~~ HOSPITAL LAB): SARS Coronavirus 2: NEGATIVE

## 2019-05-15 LAB — PROTEIN, PLEURAL OR PERITONEAL FLUID: Total protein, fluid: <3 g/dL

## 2019-05-15 LAB — ETHANOL: Alcohol, Ethyl (B): <10 mg/dL (ref <10)

## 2019-05-15 LAB — LACTIC ACID, PLASMA: Lactic Acid, Venous: UNDETERMINED mmol/L (ref 0.5–1.9)

## 2019-05-15 LAB — LIPASE, BLOOD: Lipase: 38 U/L (ref 11–51)

## 2019-05-15 MED ORDER — LORAZEPAM 2 MG/ML IJ SOLN: 1.0000 mg | INTRAMUSCULAR | Status: DC | PRN

## 2019-05-15 MED ORDER — METOPROLOL TARTRATE 25 MG PO TABS
25.0000 mg | ORAL_TABLET | Freq: Two times a day (BID) | ORAL | Status: DC
  Administered 2019-05-15 – 2019-05-16 (×3): 25 mg via ORAL
  Filled 2019-05-15 (×3): qty 1

## 2019-05-15 MED ORDER — ADULT MULTIVITAMIN W/MINERALS CH
1.0000 | ORAL_TABLET | Freq: Every day | ORAL | Status: DC
  Administered 2019-05-15 – 2019-05-18 (×4): 1 via ORAL
  Filled 2019-05-15 (×4): qty 1

## 2019-05-15 MED ORDER — ADULT MULTIVITAMIN W/MINERALS CH: 1.0000 | ORAL_TABLET | Freq: Every day | ORAL | Status: DC

## 2019-05-15 MED ORDER — FLUTICASONE PROPIONATE 50 MCG/ACT NA SUSP
2.0000 | Freq: Every day | NASAL | Status: DC | PRN
  Filled 2019-05-15: qty 16

## 2019-05-15 MED ORDER — LACTULOSE 10 GM/15ML PO SOLN
30.0000 g | Freq: Three times a day (TID) | ORAL | Status: DC
  Administered 2019-05-15 – 2019-05-16 (×4): 30 g via ORAL
  Filled 2019-05-15 (×4): qty 60

## 2019-05-15 MED ORDER — ALBUMIN HUMAN 5 % IV SOLN: 25.0000 g | Freq: Once | INTRAVENOUS | Status: DC

## 2019-05-15 MED ORDER — FOLIC ACID 1 MG PO TABS
1.0000 mg | ORAL_TABLET | Freq: Every day | ORAL | Status: DC
  Administered 2019-05-15 – 2019-05-18 (×4): 1 mg via ORAL
  Filled 2019-05-15 (×4): qty 1

## 2019-05-15 MED ORDER — POTASSIUM CHLORIDE CRYS ER 20 MEQ PO TBCR
20.0000 meq | EXTENDED_RELEASE_TABLET | Freq: Once | ORAL | Status: AC
  Administered 2019-05-15: 19:00:00 20 meq via ORAL
  Filled 2019-05-15: qty 1

## 2019-05-15 MED ORDER — VITAMIN B-1 100 MG PO TABS
100.0000 mg | ORAL_TABLET | Freq: Every day | ORAL | Status: DC
  Administered 2019-05-15 – 2019-05-18 (×4): 100 mg via ORAL
  Filled 2019-05-15 (×4): qty 1

## 2019-05-15 MED ORDER — PREDNISOLONE SODIUM PHOSPHATE 15 MG/5ML PO SOLN
60.0000 mg | Freq: Every day | ORAL | Status: DC
  Administered 2019-05-15 – 2019-05-16 (×2): 60 mg via ORAL
  Filled 2019-05-15 (×3): qty 20

## 2019-05-15 MED ORDER — LORATADINE 10 MG PO TABS
10.0000 mg | ORAL_TABLET | Freq: Every day | ORAL | Status: DC
  Administered 2019-05-15 – 2019-05-18 (×4): 10 mg via ORAL
  Filled 2019-05-15 (×4): qty 1

## 2019-05-15 MED ORDER — PREDNISOLONE 5 MG PO TABS: 60.0000 mg | ORAL_TABLET | Freq: Every day | ORAL | Status: DC

## 2019-05-15 MED ORDER — PNEUMOCOCCAL VAC POLYVALENT 25 MCG/0.5ML IJ INJ: 0.5000 mL | INJECTION | INTRAMUSCULAR | Status: DC

## 2019-05-15 MED ORDER — ALBUMIN HUMAN 5 % IV SOLN
25.0000 g | Freq: Every day | INTRAVENOUS | Status: AC
  Administered 2019-05-15 (×2): 12.5 g via INTRAVENOUS
  Administered 2019-05-16: 25 g via INTRAVENOUS
  Filled 2019-05-15 (×2): qty 500

## 2019-05-15 MED ORDER — SODIUM CHLORIDE 0.9 % IV SOLN
1.0000 g | INTRAVENOUS | Status: DC
  Administered 2019-05-15 – 2019-05-18 (×4): 1 g via INTRAVENOUS
  Filled 2019-05-15: qty 1
  Filled 2019-05-15 (×2): qty 10
  Filled 2019-05-15 (×3): qty 1

## 2019-05-15 MED ORDER — LORAZEPAM 1 MG PO TABS
1.0000 mg | ORAL_TABLET | ORAL | Status: DC | PRN
  Administered 2019-05-15: 1 mg via ORAL
  Filled 2019-05-15: qty 1

## 2019-05-15 MED ORDER — LEVOCETIRIZINE DIHYDROCHLORIDE 5 MG PO TABS: 5.0000 mg | ORAL_TABLET | Freq: Every day | ORAL | Status: DC

## 2019-05-15 MED ORDER — BOOST / RESOURCE BREEZE PO LIQD CUSTOM
1.0000 | Freq: Three times a day (TID) | ORAL | Status: DC
  Administered 2019-05-15 – 2019-05-17 (×4): 1 via ORAL

## 2019-05-15 NOTE — Consult Note (Signed)
[name], MD [address]  [place]  [place], [place] [address]  Main: [phone]  Fax: [phone] Pager: [phone]   Consultation  Referring Provider:     No ref. provider found Primary Care Physician:  Kirk Ruths, MD Primary Gastroenterologist: Althia Forts         Reason for Consultation:     Alcoholic liver disease, ascites  Date of Admission:  05/15/2019 Date of Consultation:  05/15/2019         HPI:   Melvin Stone is a 58 y.o. male with history of chronic alcohol use, tremors, ataxia, alcoholic hepatitis, paroxysmal A. fib, history of metabolic syndrome status post gastric bypass at [facility] by Dr. Duke Salvia in 2014 who is admitted with abdominal distention, generalized abdominal pain and tightness associated with shortness of breath, generalized fatigue, decreased appetite, as well as jaundice, dark urine.  Per patient's wife, he has been forgetful and lethargic in last 1 month.  Apparently, patient started drinking alcohol about 1 year ago after the death of his mother-in-law which was sudden and unexpected.  He has been increase of demand, initially was taking 2 shots of liquor daily but over the last few months he has increased consumption of alcohol every day.  Patient stopped working at [employer] in last 3 months.  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  Denies subjective fevers, nausea, vomiting, hematemesis, melena.  Found to have significantly elevated LFTs , alkaline phosphatase 134, AST 119, ALT 46, total bilirubin 17.7.  WBC 14.2, hemoglobin 13.3, platelets 177, PT 20.9, INR 1.8, BUN/creatinine 61/1.88, sodium 132.  Ultrasound abdomen revealed hepatic steatosis, nodularity of the liver, patent portal vein moderate volume ascites.  Patient underwent therapeutic paracentesis today, 2 L of fluid removed, fluid analysis does not suggest SBP.  Patient is started on ceftriaxone for SBP prophylaxis  No known family history of liver cancer, chronic liver  disease NSAIDs: None  Antiplts/Anticoagulants/Anti thrombotics: None  GI Procedures: Unknown  Past Medical History:  Diagnosis Date   Hypertension     Past Surgical History:  Procedure Laterality Date   gastric bypass      Prior to Admission medications   Medication Sig Start Date End Date Taking? Authorizing Provider  fluticasone (FLONASE) 50 MCG/ACT nasal spray Place 2 sprays into both nostrils daily as needed. 10/19/16  Yes [provider]  levocetirizine (XYZAL) 5 MG tablet Take 5 mg by mouth daily. 12/02/16  Yes [provider]  metoprolol tartrate (LOPRESSOR) 25 MG tablet Take 1 tablet by mouth 2 (two) times daily. 04/26/19  Yes [provider]  Multiple Vitamin (MULTIVITAMIN WITH MINERALS) TABS tablet Take 1 tablet by mouth daily.   Yes [provider]  ondansetron (ZOFRAN) 8 MG tablet Take 1 tablet by mouth every 8 (eight) hours as needed. 04/25/19  Yes [provider]   Current Facility-Administered Medications:    albumin human 5 % solution 25 g, 25 g, Intravenous, Daily, Sihaam Chrobak, Tally Due, MD   cefTRIAXone (ROCEPHIN) 1 g in sodium chloride 0.9 % 100 mL IVPB, 1 g, Intravenous, Q24H, Ouma, Bing Neighbors, NP, Last Rate: 200 mL/hr at 05/15/19 1342, 1 g at 05/15/19 1342   feeding supplement (BOOST / RESOURCE BREEZE) liquid 1 Container, 1 Container, Oral, TID BM, Vaneza Pickart, Tally Due, MD   fluticasone (FLONASE) 50 MCG/ACT nasal spray 2 spray, 2 spray, Each Nare, Daily PRN, Lang Snow, NP   folic acid (FOLVITE) tablet 1 mg, 1 mg, Oral, Daily,  Lang Snow, NP, 1 mg at 05/15/19 1104   lactulose (CHRONULAC) 10 GM/15ML solution 30 g, 30 g, Oral, TID, Ouma, Bing Neighbors, NP, 30 g at 05/15/19 1104   loratadine (CLARITIN) tablet 10 mg, 10 mg, Oral, Daily, Hall, Scott A, RPH, 10 mg at 05/15/19 1104   LORazepam (ATIVAN) tablet 1-4 mg, 1-4 mg, Oral, Q1H PRN, 1 mg at 05/15/19 1104 **OR** LORazepam (ATIVAN)  injection 1-4 mg, 1-4 mg, Intravenous, Q1H PRN, Lang Snow, NP   metoprolol tartrate (LOPRESSOR) tablet 25 mg, 25 mg, Oral, BID, Ouma, Bing Neighbors, NP, 25 mg at 05/15/19 1104   multivitamin with minerals tablet 1 tablet, 1 tablet, Oral, Daily, Ouma, Bing Neighbors, NP, 1 tablet at 05/15/19 1104   [START ON 05/16/2019] pneumococcal 23 valent vaccine (PNU-IMMUNE) injection 0.5 mL, 0.5 mL, Intramuscular, Tomorrow-1000, Ouma, Bing Neighbors, NP   potassium chloride SA (KLOR-CON) CR tablet 20 mEq, 20 mEq, Oral, Once, Max Sane, MD   prednisoLONE (ORAPRED) 15 MG/5ML solution 60 mg, 60 mg, Oral, QAC breakfast, Sherwin Hollingshed, Tally Due, MD   thiamine (VITAMIN B-1) tablet 100 mg, 100 mg, Oral, Daily, Ouma, Bing Neighbors, NP, 100 mg at 05/15/19 1104   Family History  Problem Relation Age of Onset   Hypertension Mother      Social History   Tobacco Use   Smoking status: Never Smoker   Smokeless tobacco: Never Used  Substance Use Topics   Alcohol use: Yes    Alcohol/week: 4.0 standard drinks    Types: 4 Cans of beer per week   Drug use: No    Allergies as of 05/15/2019 - Review Complete 05/15/2019  Allergen Reaction Noted   Other  05/15/2019    Review of Systems:    All systems reviewed and negative except where noted in HPI.   Physical Exam:  Vital signs in last 24 hours: Temp:  [97.6 F (36.4 C)-98.4 F (36.9 C)] 98.2 F (36.8 C) (09/29 1622) Pulse Rate:  [85-99] 85 (09/29 1622) Resp:  [17-22] 20 (09/29 1622) BP: (93-108)/(70-86) 93/71 (09/29 1622) SpO2:  [95 %-99 %] 98 % (09/29 1622) Weight:  [84.4 kg-96 kg] 96 kg (09/29 0552) Last BM Date: 05/15/19 General: Lethargic, not in distress Head:  Normocephalic and atraumatic. Eyes: Deep icterus.   Conjunctiva pink. PERRLA. Ears:  Normal auditory acuity. Neck:  Supple; no masses or thyroidomegaly Lungs: Respirations even and unlabored. Lungs clear to auscultation bilaterally.   No wheezes,  crackles, or rhonchi.  Heart:  Regular rate and rhythm;  Without murmur, clicks, rubs or gallops Abdomen:  Soft, diffusely distended, nontender. Normal bowel sounds. No appreciable masses or hepatomegaly.  No rebound or guarding.  Rectal:  Not performed. Msk:  Symmetrical without gross deformities.  Extremities:  Without edema, cyanosis or clubbing, significant muscle wasting in bilateral upper and lower extremities. Neurologic:  Alert and oriented x1 Skin:  Intact without significant lesions or rashes, yellow skin Psych:  Alert and cooperative.  Delirious  LAB RESULTS: CBC Latest Ref Rng & Units 05/15/2019 05/15/2019 01/30/2019  WBC 4.0 - 10.5 K/uL 13.9(H) 14.2(H) 4.0  Hemoglobin 13.0 - 17.0 g/dL 13.2 13.3 13.2  Hematocrit 39.0 - 52.0 % 36.7(L) 37.6(L) 37.3(L)  Platelets 150 - 400 K/uL 160 177 94(L)    BMET BMP Latest Ref Rng & Units 05/15/2019 05/15/2019 01/30/2019  Glucose 70 - 99 mg/dL 78 84 91  BUN 6 - 20 mg/dL 64(H) 61(H) 7  Creatinine 0.61 - 1.24 mg/dL 1.96(H) 1.88(H) 0.62  Sodium 135 -  145 mmol/L 132(L) 132(L) 140  Potassium 3.5 - 5.1 mmol/L 3.4(L) 3.4(L) 3.9  Chloride 98 - 111 mmol/L 96(L) 96(L) 103  CO2 22 - 32 mmol/L 21(L) 21(L) 21(L)  Calcium 8.9 - 10.3 mg/dL 7.8(L) 7.9(L) 8.1(L)    LFT Hepatic Function Latest Ref Rng & Units 05/15/2019 05/15/2019 05/15/2019  Total Protein 6.5 - 8.1 g/dL 6.8 6.2(L) 6.9  Albumin 3.5 - 5.0 g/dL 1.5(L) 1.3(L) 1.6(L)  AST 15 - 41 U/L 120(H) 116(H) 119(H)  ALT 0 - 44 U/L 48(H) 43 46(H)  Alk Phosphatase 38 - 126 U/L 128(H) 118 134(H)  Total Bilirubin 0.3 - 1.2 mg/dL 17.4(H) 15.5(H) 17.7(H)  Bilirubin, Direct 0.0 - 0.2 mg/dL - 8.8(H) -     STUDIES: US Paracentesis  Result Date: 05/15/2019 INDICATION: Ascites EXAM: ULTRASOUND GUIDED  PARACENTESIS MEDICATIONS: None. COMPLICATIONS: None immediate. PROCEDURE: Informed written consent was obtained from the patient after a discussion of the risks, benefits and alternatives to treatment. A timeout  was performed prior to the initiation of the procedure. Initial ultrasound scanning demonstrates a large amount of ascites within the right lower abdominal quadrant. The right lower abdomen was prepped and draped in the usual sterile fashion. 1% lidocaine was used for local anesthesia. Following this, a 6 French catheter was introduced. An ultrasound image was saved for documentation purposes. The paracentesis was performed. The catheter was removed and a dressing was applied. The patient tolerated the procedure well without immediate post procedural complication. Patient received post-procedure intravenous albumin; see nursing notes for details. FINDINGS: A total of approximately 2 L of dark yellow fluid was removed. Samples were sent to the laboratory as requested by the clinical team. IMPRESSION: Successful ultrasound-guided paracentesis yielding 2 liters of peritoneal fluid. Electronically Signed   By: Marcello Moores  Register   On: 05/15/2019 10:11   Dg Chest Port 1 View  Result Date: 05/15/2019 CLINICAL DATA:  58 year old male with jaundice, weakness. EXAM: PORTABLE CHEST 1 VIEW COMPARISON:  Chest radiographs 12/16/2016 and earlier. FINDINGS: Portable AP upright view at 0206 hours. Lordotic positioning. Lower lung volumes. Stable cardiac and mediastinal contours. No pneumothorax, pulmonary edema, definite pleural effusion or acute pulmonary opacity. Paucity of bowel gas in the abdomen with hazy density instead, raising the possibility of ascites. No acute osseous abnormality identified. IMPRESSION: 1. Portable chest with lordotic positioning. No definite acute cardiopulmonary abnormality. 2. Paucity of bowel gas in the abdomen, consider ascites. Electronically Signed   By: Genevie Ann M.D.   On: 05/15/2019 02:21   [place] Ascites (abdomen Limited)  Result Date: 05/15/2019 CLINICAL DATA:  Abdominal distension EXAM: LIMITED ABDOMEN ULTRASOUND FOR ASCITES TECHNIQUE: Limited ultrasound survey for ascites was performed in  all four abdominal quadrants. COMPARISON:  Same day right upper quadrant ultrasound. Abdominal ultrasound January 30, 2019 FINDINGS: A moderate volume ascites is present in all 4 abdominal quadrants. Nodular hepatic surface contour compatible with intrinsic liver disease. IMPRESSION: Moderate volume of abdominal ascites present in all 4 abdominal quadrants. Electronically Signed   By: Lovena Le M.D.   On: 05/15/2019 03:48   US Abdomen Limited Ruq  Result Date: 05/15/2019 CLINICAL DATA:  Jaundice, hepatomegaly EXAM: ULTRASOUND ABDOMEN LIMITED RIGHT UPPER QUADRANT COMPARISON:  Ultrasound 01/30/2020 FINDINGS: Gallbladder: There is mild gallbladder distention with a mixture of biliary sludge and gallstones layering dependently within the gallbladder. Largest calculus measures up to 6 mm in size. Gallbladder wall is borderline thickened. Sonographic Percell Miller sign is reportedly negative. Common bile duct: Diameter: 5.1 mm, within normal limits. Liver: Diffusely increased  hepatic echogenicity with loss of definition of the portal triads and diminished posterior through transmission compatible with hepatic steatosis. Nodular hepatic surface contour. Portal vein is patent on color Doppler imaging with reversal of flow away from the liver parenchyma. Other: Moderate volume ascites IMPRESSION: Stigmata of cirrhosis with a nodular hepatic surface contour, reversal of the portal venous flow and moderate ascites. Diffusely increased hepatic echogenicity compatible with hepatic steatosis. Mild gallbladder distention with gallstones and sludging. Mild wall thickening is nonspecific in the setting of ascites and intrinsic liver disease. Sonographic Percell Miller sign is negative, therefore findings remain equivocal for acute cholecystitis. Correlate with clinical exam findings. Electronically Signed   By: Lovena Le M.D.   On: 05/15/2019 03:46      Impression / Plan:   Melvin Stone is a 58 y.o. male with history of metabolic  syndrome status post gastric bypass, 1 year history of alcohol abuse admitted with alcoholic hepatitis  Alcoholic hepatitis: There is no evidence of biliary obstruction and his LFTs are consistent with severe alcoholic hepatitis Discriminant score 43.6, 30-day mortality is >50% based on the score Chest x-ray with no evidence of pneumonia, no evidence of SBP, UA negative Recommend blood cultures Acute hepatitis panel pending Likely to start prednisolone tomorrow based on his LFTs Complete abstinence from alcohol High-protein diet, encourage p.o. nutrition Dietitian consult  AKI: Most likely prerenal Recommend Albumin 5% 25 g daily Monitor urine output Consult nephrology  Patient is critically ill and expressed the same with patient's wife  Thank you for involving me in the care of this patient.  Will follow along with you    LOS: 0 days   Sherri Sear, MD  05/15/2019, 4:42 PM   Note: This dictation was prepared with Dragon dictation along with smaller phrase technology. Any transcriptional errors that result from this process are unintentional.

## 2019-05-15 NOTE — ED Provider Notes (Signed)
[facility] Emergency Department Provider Note   ____________________________________________   First MD Initiated Contact with Patient 05/15/19 0154     (approximate)  I have reviewed the triage vital signs and the nursing notes.   HISTORY  Chief Complaint Abdominal distention   HPI Melvin Stone is a 58 y.o. male brought to the ED from home via EMS with a chief complaint of abdominal distention and yellowing of his skin.  Patient admits he is a known alcoholic who stopped drinking 3 weeks ago.  Since that time his wife has noticed yellowing of his skin as well as increased abdominal distention.  Wife states he is "stubborn" and refused to go to the doctor.  Patient complains of abdominal pain, nausea and diarrhea.  Denies fever, cough, chest pain, shortness of breath, vomiting.  Denies overuse of acetaminophen or exposure to persons with known hepatitis.        Past Medical History:  Diagnosis Date  . Hypertension     Patient Active Problem List   Diagnosis Date Noted  . Chest pain 12/16/2016    Past Surgical History:  Procedure Laterality Date  . gastric bypass      Prior to Admission medications   Medication Sig Start Date End Date Taking? Authorizing Provider  fluticasone (FLONASE) 50 MCG/ACT nasal spray Place 2 sprays into both nostrils daily as needed. 10/19/16  Yes [provider]  ondansetron (ZOFRAN) 8 MG tablet Take 1 tablet by mouth every 8 (eight) hours as needed. 04/25/19  Yes [provider]  levocetirizine (XYZAL) 5 MG tablet Take 5 mg by mouth daily. 12/02/16   [provider]  metoprolol tartrate (LOPRESSOR) 25 MG tablet Take 1 tablet by mouth 2 (two) times daily. 04/26/19   [provider]  Multiple Vitamin (MULTIVITAMIN WITH MINERALS) TABS tablet Take 1 tablet by mouth daily.    [provider]    Allergies Other  Family History  Problem Relation Age of Onset  . Hypertension  Mother     Social History Social History   Tobacco Use  . Smoking status: Never Smoker  . Smokeless tobacco: Never Used  Substance Use Topics  . Alcohol use: Yes    Alcohol/week: 4.0 standard drinks    Types: 4 Cans of beer per week  . Drug use: No    Review of Systems  Constitutional: No fever/chills Eyes: No visual changes. ENT: No sore throat. Cardiovascular: Denies chest pain. Respiratory: Denies shortness of breath. Gastrointestinal: Positive for abdominal pain and nausea, no vomiting.  Positive for diarrhea.  No constipation. Genitourinary: Negative for dysuria. Musculoskeletal: Negative for back pain. Skin: Positive for yellowing of the skin.  Negative for rash. Neurological: Negative for headaches, focal weakness or numbness.   ____________________________________________   PHYSICAL EXAM:  VITAL SIGNS: ED Triage Vitals  Enc Vitals Group     BP      Pulse      Resp      Temp      Temp src      SpO2      Weight      Height      Head Circumference      Peak Flow      Pain Score      Pain Loc      Pain Edu?      Excl. in GC?     Constitutional: Alert and oriented.  Uncomfortable appearing and in mild acute distress. Eyes: Scleral icterus. PERRL. EOMI.  Head: Atraumatic. Nose: No congestion/rhinnorhea. Mouth/Throat: Mucous membranes are moist.  Oropharynx non-erythematous. Neck: No stridor.   Cardiovascular: Normal rate, regular rhythm. Grossly normal heart sounds.  Good peripheral circulation. Respiratory: Normal respiratory effort.  No retractions. Lungs CTAB. Gastrointestinal: Soft and mildly tender to palpation right upper quadrant without rebound or guarding.  Hepatomegaly.  Moderate distention. No abdominal bruits. No CVA tenderness. Musculoskeletal: No lower extremity tenderness nor edema.  No joint effusions. Neurologic:  Normal speech and language. No gross focal neurologic deficits are appreciated. No gait instability. Skin:  Skin is  jaundiced.  Warm, dry and intact. No rash noted.  Scattered bruising. Psychiatric: Mood and affect are normal. Speech and behavior are normal.  ____________________________________________   LABS (all labs ordered are listed, but only abnormal results are displayed)  Labs Reviewed  CBC WITH DIFFERENTIAL/PLATELET - Abnormal; Notable for the following components:      Result Value   WBC 14.2 (*)    RBC 3.79 (*)    HCT 37.6 (*)    MCH 35.1 (*)    Neutro Abs 10.4 (*)    Monocytes Absolute 1.3 (*)    Abs Immature Granulocytes 0.11 (*)    All other components within normal limits  COMPREHENSIVE METABOLIC PANEL - Abnormal; Notable for the following components:   Sodium 132 (*)    Potassium 3.4 (*)    Chloride 96 (*)    CO2 21 (*)    BUN 61 (*)    Creatinine, Ser 1.88 (*)    Calcium 7.9 (*)    Albumin 1.6 (*)    AST 119 (*)    ALT 46 (*)    Alkaline Phosphatase 134 (*)    Total Bilirubin 17.7 (*)    GFR calc non Af Amer 39 (*)    GFR calc Af Amer 45 (*)    All other components within normal limits  PROTIME-INR - Abnormal; Notable for the following components:   Prothrombin Time 20.9 (*)    INR 1.8 (*)    All other components within normal limits  AMMONIA - Abnormal; Notable for the following components:   Ammonia 66 (*)    All other components within normal limits  SARS CORONAVIRUS 2 (HOSPITAL ORDER, [place] [facility])  ETHANOL  LIPASE, BLOOD  LACTIC ACID, PLASMA  LACTIC ACID, PLASMA  HEPATITIS PANEL, ACUTE  URINALYSIS, COMPLETE (UACMP) WITH MICROSCOPIC  URINE DRUG SCREEN, QUALITATIVE ([facility] ONLY)  TROPONIN I (HIGH SENSITIVITY)  TROPONIN I (HIGH SENSITIVITY)   ____________________________________________  EKG  ED ECG REPORT I, Andrey Hoobler J, the attending physician, personally viewed and interpreted this ECG.   Date: 05/15/2019  EKG Time: 0156  Rate: 92  Rhythm: normal EKG, normal sinus rhythm  Axis: Normal  Intervals:none  ST&T Change:  Nonspecific  ____________________________________________  RADIOLOGY  ED MD interpretation: No acute cardiopulmonary process  Official radiology report(s): Dg Chest Port 1 View  Result Date: 05/15/2019 CLINICAL DATA:  58 year old male with jaundice, weakness. EXAM: PORTABLE CHEST 1 VIEW COMPARISON:  Chest radiographs 12/16/2016 and earlier. FINDINGS: Portable AP upright view at 0206 hours. Lordotic positioning. Lower lung volumes. Stable cardiac and mediastinal contours. No pneumothorax, pulmonary edema, definite pleural effusion or acute pulmonary opacity. Paucity of bowel gas in the abdomen with hazy density instead, raising the possibility of ascites. No acute osseous abnormality identified. IMPRESSION: 1. Portable chest with lordotic positioning. No definite acute cardiopulmonary abnormality. 2. Paucity of bowel gas in the abdomen, consider ascites. Electronically Signed   By:  Odessa Fleming M.D.   On: 05/15/2019 02:21   [place] Ascites (abdomen Limited)  Result Date: 05/15/2019 CLINICAL DATA:  Abdominal distension EXAM: LIMITED ABDOMEN ULTRASOUND FOR ASCITES TECHNIQUE: Limited ultrasound survey for ascites was performed in all four abdominal quadrants. COMPARISON:  Same day right upper quadrant ultrasound. Abdominal ultrasound January 30, 2019 FINDINGS: A moderate volume ascites is present in all 4 abdominal quadrants. Nodular hepatic surface contour compatible with intrinsic liver disease. IMPRESSION: Moderate volume of abdominal ascites present in all 4 abdominal quadrants. Electronically Signed   By: Kreg Shropshire M.D.   On: 05/15/2019 03:48   US Abdomen Limited Ruq  Result Date: 05/15/2019 CLINICAL DATA:  Jaundice, hepatomegaly EXAM: ULTRASOUND ABDOMEN LIMITED RIGHT UPPER QUADRANT COMPARISON:  Ultrasound 01/30/2020 FINDINGS: Gallbladder: There is mild gallbladder distention with a mixture of biliary sludge and gallstones layering dependently within the gallbladder. Largest calculus measures up to 6 mm in  size. Gallbladder wall is borderline thickened. Sonographic Eulah Pont sign is reportedly negative. Common bile duct: Diameter: 5.1 mm, within normal limits. Liver: Diffusely increased hepatic echogenicity with loss of definition of the portal triads and diminished posterior through transmission compatible with hepatic steatosis. Nodular hepatic surface contour. Portal vein is patent on color Doppler imaging with reversal of flow away from the liver parenchyma. Other: Moderate volume ascites IMPRESSION: Stigmata of cirrhosis with a nodular hepatic surface contour, reversal of the portal venous flow and moderate ascites. Diffusely increased hepatic echogenicity compatible with hepatic steatosis. Mild gallbladder distention with gallstones and sludging. Mild wall thickening is nonspecific in the setting of ascites and intrinsic liver disease. Sonographic Eulah Pont sign is negative, therefore findings remain equivocal for acute cholecystitis. Correlate with clinical exam findings. Electronically Signed   By: Kreg Shropshire M.D.   On: 05/15/2019 03:46    ____________________________________________   PROCEDURES  Procedure(s) performed (including Critical Care):  Procedures  CRITICAL CARE Performed by: Irean Hong   Total critical care time: 30 minutes  Critical care time was exclusive of separately billable procedures and treating other patients.  Critical care was necessary to treat or prevent imminent or life-threatening deterioration.  Critical care was time spent personally by me on the following activities: development of treatment plan with patient and/or surrogate as well as nursing, discussions with consultants, evaluation of patient's response to treatment, examination of patient, obtaining history from patient or surrogate, ordering and performing treatments and interventions, ordering and review of laboratory studies, ordering and review of radiographic studies, pulse oximetry and re-evaluation  of patient's condition.  ____________________________________________   INITIAL IMPRESSION / ASSESSMENT AND PLAN / ED COURSE  As part of my medical decision making, I reviewed the following data within the electronic MEDICAL RECORD NUMBER Nursing notes reviewed and incorporated, Labs reviewed, EKG interpreted, Old chart reviewed, Radiograph reviewed, Discussed with admitting physician and Notes from prior ED visits     Melvin Stone was evaluated in Emergency Department on 05/15/2019 for the symptoms described in the history of present illness. He was evaluated in the context of the global COVID-19 pandemic, which necessitated consideration that the patient might be at risk for infection with the SARS-CoV-2 virus that causes COVID-19. Institutional protocols and algorithms that pertain to the evaluation of patients at risk for COVID-19 are in a state of rapid change based on information released by regulatory bodies including the CDC and federal and state organizations. These policies and algorithms were followed during the patient's care in the ED.    58 year old alcoholic who stopped drinking 3 weeks  ago presenting with jaundice, hepatomegaly and abdominal distention. Differential diagnosis includes, but is not limited to, liver failure, hepatitis, biliary disease (biliary colic, acute cholecystitis, cholangitis, choledocholithiasis, etc), intrathoracic causes for epigastric abdominal pain including ACS, gastritis, duodenitis, pancreatitis, small bowel or large bowel obstruction, abdominal aortic aneurysm, hernia, and ulcer(s).  Will obtain hepatic labs including ammonia, ultrasound abdomen to evaluate for for ascites.  Rapid COVID swab in the event patient requires paracentesis.  Anticipate hospitalization.   Clinical Course as of May 14 408  Tue May 15, 2019  0322 Discussed with hospitalist NP Anna Genre who will evaluate patient in the emergency department for admission.   [JS]    Clinical Course  User Index [JS] Irean Hong, MD     ____________________________________________   FINAL CLINICAL IMPRESSION(S) / ED DIAGNOSES  Final diagnoses:  Right upper quadrant abdominal pain  Jaundice  AKI (acute kidney injury) (HCC)  Acute liver failure without hepatic coma  Weakness  Alcoholic cirrhosis of liver with ascites (HCC)  Calculus of gallbladder without cholecystitis without obstruction     ED Discharge Orders    None       Note:  This document was prepared using Dragon voice recognition software and may include unintentional dictation errors.   Irean Hong, MD 05/15/19 [phone]

## 2019-05-15 NOTE — Progress Notes (Signed)
Same day rounding progress note  58 y.o. male with pertinent past medical history of EtOH abuse, tremors, ataxia, paroxysmal atrial fibrillation, alcoholic hepatitis, hypertension, OSA, diabetes mellitus, obesity status post gastric bypass admitted for abdominal tightness, shortness of breath and generalized fatigue  1.  Alcoholic cirrhosis -patient presenting with jaundice, icterus, elevated LFTs and bilirubin levels, coagulopathy with elevated INR and ammonia levels - Lactulose titrated to 3-4BM/day - Continue to trend LFTs - Check hepatitis panel - GI consult pending  2. Ascites due to alcoholic cirrhosis - Ultrasound ascites reviewed and shows moderate volume of abdominal ascites present in all 4 abdominal quadrants. - Holding diuresis in the setting of AKI -Status post IR paracentesis with removal of 2 L of fluid - SBP Prophylaxis with Ceftriaxone 1g IV daily - ( albumin <1.6 with renal/liver impairment (Cr>1.2, BUN>25,  Bili >3) elevated white count  3. AKI - Hepatorenal?   - Hold nephrotoxins -We will consider nephrology consult if albumin challenge needed to confirm not pre-renal  - Continue to monitor renal functions  4. Hyperammonemia -secondary to above, no evidence of hepatic encephalopathy - Lactulose as above, last ammonia of 66 - Continue to trend ammonia levels  5. Paroxysmal atrial fibrillation -rate controlled on metoprolol - Currently not on anticoagulation, may be high risk for bleed  6.  Hypertension -continue metoprolol  7.  EtOH Abuse - Last Drink 3 weeks ago - Daily Thiamine, Folate, MVI  - SW consult for cessation resources - PT/OT evaluation for mobility - CIWA +/- Standing Protocol   8. DVT prophylaxis - Hold anti-coagulation for pending procedure    Time spent: 15 minutes

## 2019-05-15 NOTE — H&P (Addendum)
[facility] NAME: Melvin Stone    MR#:  [id number]  DATE OF BIRTH:  03/14/1961  DATE OF ADMISSION:  05/15/2019  PRIMARY CARE PHYSICIAN: Kirk Ruths, MD   REQUESTING/REFERRING PHYSICIAN: Lurline Hare, MD  CHIEF COMPLAINT:   Chief Complaint  Patient presents with  . Abdominal Pain    HISTORY OF PRESENT ILLNESS:  58 y.o. male with pertinent past medical history of EtOH abuse, tremors, ataxia, paroxysmal atrial fibrillation, alcoholic hepatitis, hypertension, OSA, diabetes mellitus, obesity status post gastric bypass presenting to the ED with chief complaints of abdominal tightness, shortness of breath and generalized fatigue.  Patient report onset of symptoms since 3 weeks ago with progressing worsening in the last 2 to 3 days.  He describes symptoms of abdominal tightness worse with walking or rolling over associated with shortness of breath, diarrhea, decreased appetite and generalized fatigue.  He is also endorsing urinary frequency and dark urine.  Per patient's wife who is currently at the bedside, patient has been sleeping a lot and more forgetful than usual in the last 1 month.  Denies fevers or chills, abdominal pain, nausea or vomiting, chest pain, cough, hematuria, or any other related GI symptoms.  On arrival to the ED, he was afebrile with blood pressure 108/79 mm Hg and pulse rate 93 beats/min. There were no focal neurological deficits; he was alert and oriented x4, and he did not demonstrate any memory deficits.  Labs revealed WBC of 14.2, sodium 132, potassium 3.4, BUN 61, creatinine 1.88, AST 119, ALT 46, alk phos 134, bilirubin 17.7, ammonia level 66, albumin 1.6, COVID-19 negative.  Ultrasound abdomen right upper quadrant showed mild gallbladder distention and hepatic steatosis.  Sinusitis showed moderate volume of abdominal ascites present in all 4 quadrant.  Hospitalist asked to admit for further management.   PAST MEDICAL HISTORY:   Past Medical History:  Diagnosis Date  . Hypertension     PAST SURGICAL HISTORY:   Past Surgical History:  Procedure Laterality Date  . gastric bypass      SOCIAL HISTORY:   Social History   Tobacco Use  . Smoking status: Never Smoker  . Smokeless tobacco: Never Used  Substance Use Topics  . Alcohol use: Yes    Alcohol/week: 4.0 standard drinks    Types: 4 Cans of beer per week    FAMILY HISTORY:   Family History  Problem Relation Age of Onset  . Hypertension Mother     DRUG ALLERGIES:   Allergies  Allergen Reactions  . Other     REVIEW OF SYSTEMS:   Review of Systems  Constitutional: Positive for malaise/fatigue. Negative for chills, fever and weight loss.  HENT: Negative for congestion, hearing loss and sore throat.   Eyes: Negative for blurred vision and double vision.  Respiratory: Positive for shortness of breath. Negative for cough and wheezing.   Cardiovascular: Positive for orthopnea and leg swelling. Negative for chest pain and palpitations.  Gastrointestinal: Positive for diarrhea. Negative for abdominal pain, nausea and vomiting.       Abdominal tightness  Genitourinary: Positive for frequency and urgency. Negative for dysuria.  Musculoskeletal: Negative for myalgias.  Skin: Negative for rash.  Neurological: Positive for tremors and weakness. Negative for dizziness, sensory change, speech change, focal weakness and headaches.  Psychiatric/Behavioral: Negative for depression.   MEDICATIONS AT HOME:   Prior to Admission medications   Medication Sig Start Date End Date Taking? Authorizing Provider  fluticasone Asencion Islam)  50 MCG/ACT nasal spray Place 2 sprays into both nostrils daily as needed. 10/19/16   [provider]  levocetirizine (XYZAL) 5 MG tablet Take 5 mg by mouth daily. 12/02/16   [provider]  metoprolol (LOPRESSOR) 50 MG tablet Take 50 mg by mouth 2 (two) times daily.  11/09/16   [provider]  Multiple Vitamin (MULTIVITAMIN WITH MINERALS) TABS tablet Take 1 tablet by mouth daily.    [provider]      VITAL SIGNS:  Blood pressure 105/86, pulse 88, temperature 98.4 F (36.9 C), temperature source Oral, resp. rate (!) 22, height '5\' 4"'$  (1.626 m), weight 84.4 kg, SpO2 97 %.  PHYSICAL EXAMINATION:   Physical Exam  GENERAL:  58 y.o.-year-old patient lying in the bed with no acute distress.  EYES: Pupils equal, round, reactive to light and accommodation. scleral icterus. Extraocular muscles intact.  HEENT: Head atraumatic, normocephalic. Oropharynx and nasopharynx clear.  NECK:  Supple, no jugular venous distention. No thyroid enlargement, no tenderness.  LUNGS: Normal breath sounds bilaterally, no wheezing, rales,rhonchi or crepitation. No use of accessory muscles of respiration.  CARDIOVASCULAR: S1, S2 normal. No murmurs, rubs, or gallops.  ABDOMEN: Hard and distended. nontender,. Bowel sounds present. No organomegaly or mass.  EXTREMITIES: No pedal edema, cyanosis, or clubbing.  NEUROLOGIC: Cranial nerves II through XII are intact. Muscle strength 5/5 in all extremities. Sensation intact. Gait not checked.  PSYCHIATRIC: The patient is alert and oriented x 3.  SKIN: No obvious rash, lesion, or ulcer. Jaundiced  DATA REVIEWED:  LABORATORY PANEL:   CBC Recent Labs  Lab 05/15/19 0211  WBC 14.2*  HGB 13.3  HCT 37.6*  PLT 177   ------------------------------------------------------------------------------------------------------------------  Chemistries  Recent Labs  Lab 05/15/19 0211  NA 132*  K 3.4*  CL 96*  CO2 21*  GLUCOSE 84  BUN 61*  CREATININE 1.88*  CALCIUM 7.9*  AST 119*  ALT 46*  ALKPHOS 134*  BILITOT 17.7*   ------------------------------------------------------------------------------------------------------------------  Cardiac Enzymes No results for input(s): TROPONINI in the last 168 hours.  ------------------------------------------------------------------------------------------------------------------  RADIOLOGY:  Dg Chest Port 1 View  Result Date: 05/15/2019 CLINICAL DATA:  58 year old male with jaundice, weakness. EXAM: PORTABLE CHEST 1 VIEW COMPARISON:  Chest radiographs 12/16/2016 and earlier. FINDINGS: Portable AP upright view at 0206 hours. Lordotic positioning. Lower lung volumes. Stable cardiac and mediastinal contours. No pneumothorax, pulmonary edema, definite pleural effusion or acute pulmonary opacity. Paucity of bowel gas in the abdomen with hazy density instead, raising the possibility of ascites. No acute osseous abnormality identified. IMPRESSION: 1. Portable chest with lordotic positioning. No definite acute cardiopulmonary abnormality. 2. Paucity of bowel gas in the abdomen, consider ascites. Electronically Signed   By: Genevie Ann M.D.   On: 05/15/2019 02:21   [place] Ascites (abdomen Limited)  Result Date: 05/15/2019 CLINICAL DATA:  Abdominal distension EXAM: LIMITED ABDOMEN ULTRASOUND FOR ASCITES TECHNIQUE: Limited ultrasound survey for ascites was performed in all four abdominal quadrants. COMPARISON:  Same day right upper quadrant ultrasound. Abdominal ultrasound January 30, 2019 FINDINGS: A moderate volume ascites is present in all 4 abdominal quadrants. Nodular hepatic surface contour compatible with intrinsic liver disease. IMPRESSION: Moderate volume of abdominal ascites present in all 4 abdominal quadrants. Electronically Signed   By: Lovena Le M.D.   On: 05/15/2019 03:48   US Abdomen Limited Ruq  Result Date: 05/15/2019 CLINICAL DATA:  Jaundice, hepatomegaly EXAM: ULTRASOUND ABDOMEN LIMITED RIGHT UPPER QUADRANT COMPARISON:  Ultrasound 01/30/2020 FINDINGS: Gallbladder: There is mild gallbladder distention  with a mixture of biliary sludge and gallstones layering dependently within the gallbladder. Largest calculus measures up to 6 mm in size. Gallbladder wall is  borderline thickened. Sonographic Percell Miller sign is reportedly negative. Common bile duct: Diameter: 5.1 mm, within normal limits. Liver: Diffusely increased hepatic echogenicity with loss of definition of the portal triads and diminished posterior through transmission compatible with hepatic steatosis. Nodular hepatic surface contour. Portal vein is patent on color Doppler imaging with reversal of flow away from the liver parenchyma. Other: Moderate volume ascites IMPRESSION: Stigmata of cirrhosis with a nodular hepatic surface contour, reversal of the portal venous flow and moderate ascites. Diffusely increased hepatic echogenicity compatible with hepatic steatosis. Mild gallbladder distention with gallstones and sludging. Mild wall thickening is nonspecific in the setting of ascites and intrinsic liver disease. Sonographic Percell Miller sign is negative, therefore findings remain equivocal for acute cholecystitis. Correlate with clinical exam findings. Electronically Signed   By: Lovena Le M.D.   On: 05/15/2019 03:46    EKG:  EKG: normal EKG, normal sinus rhythm, unchanged from previous tracings. Vent. rate 93 BPM PR interval * ms QRS duration 126 ms QT/QTc 413/514 ms P-R-T axes 32 22 254 IMPRESSION AND PLAN:   58 y.o. male with pertinent past medical history of EtOH abuse, tremors, ataxia, paroxysmal atrial fibrillation, alcoholic hepatitis, hypertension, OSA, diabetes mellitus, obesity status post gastric bypass presenting to the ED with chief complaints of abdominal tightness, shortness of breath and generalized fatigue  1.  Acute alcoholic cirrhosis -patient presenting with jaundice, icterus, elevated LFTs and bilirubin levels, coagulopathy with elevated INR and ammonia levels - Admit to MedSurg unit - Lactulose titrated to 3-4BM/day - Continue to trend LFTs - Check hepatitis panel - GI consult. Message sent via [facility] to Dr. Jonathon Bellows  2. Ascites due to alcoholic cirrhosis - Ultrasound ascites  reviewed and shows moderate volume of abdominal ascites present in all 4 abdominal quadrants. - Holding diuresis in the setting of AKI - Will order IR therapeutic paracentesis - SBP Prophylaxis with Ceftriaxone 1g IV daily - ( albumin <1.6 with renal/liver impairment (Cr>1.2, BUN>25,  Bili >3) elevated white count  3. AKI - Hepatorenal?   - Hold nephrotoxins - Consider nephrology consult if albumin challenge needed to confirm not pre-renal  - Continue to monitor renal functions  4. Hyperammonemia -secondary to above, no evidence of hepatic encephalopathy - Lactulose as above - Continue to trend ammonia levels  5. Paroxysmal atrial fibrillation -rate controlled on metoprolol - Currently not on anticoagulation  6. Hypertension -continue metoprolol  7.  EtOH Abuse - Last Drink 3 weeks ago - Daily Thiamine, Folate, MVI  - SW consult for cessation resources - PT/OT evaluation for mobility - CIWA +/- Standing Protocol   8. DVT prophylaxis - Hold anti-coagulation for pending procedure    All the records are reviewed and case discussed with ED provider. Management plans discussed with the patient, family and they are in agreement.  CODE STATUS: FULL  TOTAL TIME TAKING CARE OF THIS PATIENT: 50 minutes.    on 05/15/2019 at 4:56 AM  Rufina Falco, DNP, FNP-BC [facility] Nurse Practitioner Between 7am to 6pm - Pager [phone]  After 6pm go to [email or web address] - password [email or web address] [facility]  Office  [phone]  CC: Primary care physician; Kirk Ruths, MD

## 2019-05-15 NOTE — ED Notes (Signed)
ED TO INPATIENT HANDOFF REPORT  ED Nurse Name and Phone #: Rande Brunt, RN  S Name/Age/Gender Melvin Stone 58 y.o. male Room/Bed: ED02A/ED02A  Code Status   Code Status: Full Code  Home/SNF/Other Home Patient oriented to: self, place, time and situation Is this baseline? Yes   Triage Complete: Triage complete  Chief Complaint Abd Pain  Triage Note Pt in with co abd tightness for over a week, and wife noticed his skin was yellow tonight. Pt complaining of dark urine and urinary frequency. Pt does not have hx of liver problems but was an alcoholic, last drink was 3 weeks ago.    Allergies Allergies  Allergen Reactions  . Other     Level of Care/Admitting Diagnosis ED Disposition    ED Disposition Condition Comment   Admit  Hospital Area: [facility] [[id number]]  Level of Care: Med-Surg [16]  Covid Evaluation: Confirmed COVID Negative  Diagnosis: Ascites due to alcoholic cirrhosis [facility]) [[id number]]  Admitting Physician: Cristie Hem  Attending Physician: Webb Silversmith ACHIENG [phone]  Estimated length of stay: past midnight tomorrow  Certification:: I certify this patient will need inpatient services for at least 2 midnights  PT Class (Do Not Modify): Inpatient [101]  PT Acc Code (Do Not Modify): Private [1]       B Medical/Surgery History Past Medical History:  Diagnosis Date  . Hypertension    Past Surgical History:  Procedure Laterality Date  . gastric bypass       A IV Location/Drains/Wounds Patient Lines/Drains/Airways Status   Active Line/Drains/Airways    Name:   Placement date:   Placement time:   Site:   Days:   Peripheral IV 01/30/19 Left Forearm   01/30/19    1734    Forearm   105   Peripheral IV 05/15/19 Anterior;Right Forearm   05/15/19    0159    Forearm   less than 1          Intake/Output Last 24 hours No intake or output data in the 24 hours ending 05/15/19 0458  Labs/Imaging Results for  orders placed or performed during the hospital encounter of 05/15/19 (from the past 48 hour(s))  CBC with Differential     Status: Abnormal   Collection Time: 05/15/19  2:11 AM  Result Value Ref Range   WBC 14.2 (H) 4.0 - 10.5 K/uL   RBC 3.79 (L) 4.22 - 5.81 MIL/uL   Hemoglobin 13.3 13.0 - 17.0 g/dL   HCT 66.2 (L) 94.7 - 65.4 %   MCV 99.2 80.0 - 100.0 fL   MCH 35.1 (H) 26.0 - 34.0 pg   MCHC 35.4 30.0 - 36.0 g/dL   RDW 65.0 35.4 - 65.6 %   Platelets 177 150 - 400 K/uL   nRBC 0.0 0.0 - 0.2 %   Neutrophils Relative % 74 %   Neutro Abs 10.4 (H) 1.7 - 7.7 K/uL   Lymphocytes Relative 15 %   Lymphs Abs 2.2 0.7 - 4.0 K/uL   Monocytes Relative 9 %   Monocytes Absolute 1.3 (H) 0.1 - 1.0 K/uL   Eosinophils Relative 1 %   Eosinophils Absolute 0.2 0.0 - 0.5 K/uL   Basophils Relative 0 %   Basophils Absolute 0.1 0.0 - 0.1 K/uL   Immature Granulocytes 1 %   Abs Immature Granulocytes 0.11 (H) 0.00 - 0.07 K/uL    Comment: Performed at [facility], [address] [place], [place] [address]  Comprehensive metabolic panel  Status: Abnormal   Collection Time: 05/15/19  2:11 AM  Result Value Ref Range   Sodium 132 (L) 135 - 145 mmol/L   Potassium 3.4 (L) 3.5 - 5.1 mmol/L   Chloride 96 (L) 98 - 111 mmol/L   CO2 21 (L) 22 - 32 mmol/L   Glucose, Bld 84 70 - 99 mg/dL   BUN 61 (H) 6 - 20 mg/dL   Creatinine, Ser 1.61 (H) 0.61 - 1.24 mg/dL   Calcium 7.9 (L) 8.9 - 10.3 mg/dL   Total Protein 6.9 6.5 - 8.1 g/dL   Albumin 1.6 (L) 3.5 - 5.0 g/dL   AST 096 (H) 15 - 41 U/L   ALT 46 (H) 0 - 44 U/L   Alkaline Phosphatase 134 (H) 38 - 126 U/L   Total Bilirubin 17.7 (H) 0.3 - 1.2 mg/dL   GFR calc non Af Amer 39 (L) >60 mL/min   GFR calc Af Amer 45 (L) >60 mL/min   Anion gap 15 5 - 15    Comment: Performed at [facility], [address] [place], [place] [address]  Ethanol     Status: None   Collection Time: 05/15/19  2:11 AM  Result Value Ref Range   Alcohol, Ethyl (B) <10 <10  mg/dL    Comment: (NOTE) Lowest detectable limit for serum alcohol is 10 mg/dL. For medical purposes only. Performed at [facility], [address] [place], [place] [address]   Lipase, blood     Status: None   Collection Time: 05/15/19  2:11 AM  Result Value Ref Range   Lipase 38 11 - 51 U/L    Comment: Performed at [facility], [address] [place], [place] [address]  Lactic acid, plasma     Status: None   Collection Time: 05/15/19  2:11 AM  Result Value Ref Range   Lactic Acid, Venous UNABLE TO REPORT DUE TO ICTERUS INTERFERENCE/RWW 0.5 - 1.9 mmol/L    Comment: Performed at [facility], [address] [place], [place] [address]  Protime-INR     Status: Abnormal   Collection Time: 05/15/19  2:11 AM  Result Value Ref Range   Prothrombin Time 20.9 (H) 11.4 - 15.2 seconds   INR 1.8 (H) 0.8 - 1.2    Comment: (NOTE) INR goal varies based on device and disease states. Performed at [facility], [address] [place], [place] [address]   Ammonia     Status: Abnormal   Collection Time: 05/15/19  2:11 AM  Result Value Ref Range   Ammonia 66 (H) 9 - 35 umol/L    Comment: HEMOLYSIS AT THIS LEVEL MAY AFFECT RESULT Performed at [facility], [address] [place], [place] [address]   Troponin I (High Sensitivity)     Status: None   Collection Time: 05/15/19  2:11 AM  Result Value Ref Range   Troponin I (High Sensitivity) 7 <18 ng/L    Comment: (NOTE) Elevated high sensitivity troponin I (hsTnI) values and significant  changes across serial measurements may suggest ACS but many other  chronic and acute conditions are known to elevate hsTnI results.  Refer to the "Links" section for chest pain algorithms and additional  guidance. Performed at [facility], [address] [place], [place] [address]   SARS Coronavirus 2 [facility] order, Performed in [facility] hospital lab) Nasopharyngeal Nasopharyngeal Swab      Status: None   Collection Time: 05/15/19  2:11 AM   Specimen:  Nasopharyngeal Swab  Result Value Ref Range   SARS Coronavirus 2 NEGATIVE NEGATIVE    Comment: (NOTE) If result is NEGATIVE SARS-CoV-2 target nucleic acids are NOT DETECTED. The SARS-CoV-2 RNA is generally detectable in upper and lower  respiratory specimens during the acute phase of infection. The lowest  concentration of SARS-CoV-2 viral copies this assay can detect is 250  copies / mL. A negative result does not preclude SARS-CoV-2 infection  and should not be used as the sole basis for treatment or other  patient management decisions.  A negative result may occur with  improper specimen collection / handling, submission of specimen other  than nasopharyngeal swab, presence of viral mutation(s) within the  areas targeted by this assay, and inadequate number of viral copies  (<250 copies / mL). A negative result must be combined with clinical  observations, patient history, and epidemiological information. If result is POSITIVE SARS-CoV-2 target nucleic acids are DETECTED. The SARS-CoV-2 RNA is generally detectable in upper and lower  respiratory specimens dur ing the acute phase of infection.  Positive  results are indicative of active infection with SARS-CoV-2.  Clinical  correlation with patient history and other diagnostic information is  necessary to determine patient infection status.  Positive results do  not rule out bacterial infection or co-infection with other viruses. If result is PRESUMPTIVE POSTIVE SARS-CoV-2 nucleic acids MAY BE PRESENT.   A presumptive positive result was obtained on the submitted specimen  and confirmed on repeat testing.  While 2019 novel coronavirus  (SARS-CoV-2) nucleic acids may be present in the submitted sample  additional confirmatory testing may be necessary for epidemiological  and / or clinical management purposes  to differentiate between  SARS-CoV-2 and other Sarbecovirus  currently known to infect humans.  If clinically indicated additional testing with an alternate test  methodology [id number]) is advised. The SARS-CoV-2 RNA is generally  detectable in upper and lower respiratory sp ecimens during the acute  phase of infection. The expected result is Negative. Fact Sheet for Patients:  StrictlyIdeas.no Fact Sheet for Healthcare Providers: BankingDealers.co.za This test is not yet approved or cleared by the [place] FDA and has been authorized for detection and/or diagnosis of SARS-CoV-2 by FDA under an Emergency Use Authorization (EUA).  This EUA will remain in effect (meaning this test can be used) for the duration of the COVID-19 declaration under Section 564(b)(1) of the Act, 21 U.S.C. section 360bbb-3(b)(1), unless the authorization is terminated or revoked sooner. Performed at [facility], [address] [place], [place] [address]    Dg Chest [place] 1 View  Result Date: 05/15/2019 CLINICAL DATA:  58 year old male with jaundice, weakness. EXAM: PORTABLE CHEST 1 VIEW COMPARISON:  Chest radiographs 12/16/2016 and earlier. FINDINGS: Portable AP upright view at 0206 hours. Lordotic positioning. Lower lung volumes. Stable cardiac and mediastinal contours. No pneumothorax, pulmonary edema, definite pleural effusion or acute pulmonary opacity. Paucity of bowel gas in the abdomen with hazy density instead, raising the possibility of ascites. No acute osseous abnormality identified. IMPRESSION: 1. Portable chest with lordotic positioning. No definite acute cardiopulmonary abnormality. 2. Paucity of bowel gas in the abdomen, consider ascites. Electronically Signed   By: Genevie Ann M.D.   On: 05/15/2019 02:21   [place] Ascites (abdomen Limited)  Result Date: 05/15/2019 CLINICAL DATA:  Abdominal distension EXAM: LIMITED ABDOMEN ULTRASOUND FOR ASCITES TECHNIQUE: Limited ultrasound survey for ascites was performed  in all four abdominal quadrants. COMPARISON:  Same day right upper quadrant ultrasound. Abdominal ultrasound  January 30, 2019 FINDINGS: A moderate volume ascites is present in all 4 abdominal quadrants. Nodular hepatic surface contour compatible with intrinsic liver disease. IMPRESSION: Moderate volume of abdominal ascites present in all 4 abdominal quadrants. Electronically Signed   By: Kreg ShropshirePrice  DeHay M.D.   On: 05/15/2019 03:48   [place] Abdomen Limited Ruq  Result Date: 05/15/2019 CLINICAL DATA:  Jaundice, hepatomegaly EXAM: ULTRASOUND ABDOMEN LIMITED RIGHT UPPER QUADRANT COMPARISON:  Ultrasound 01/30/2020 FINDINGS: Gallbladder: There is mild gallbladder distention with a mixture of biliary sludge and gallstones layering dependently within the gallbladder. Largest calculus measures up to 6 mm in size. Gallbladder wall is borderline thickened. Sonographic Eulah PontMurphy sign is reportedly negative. Common bile duct: Diameter: 5.1 mm, within normal limits. Liver: Diffusely increased hepatic echogenicity with loss of definition of the portal triads and diminished posterior through transmission compatible with hepatic steatosis. Nodular hepatic surface contour. Portal vein is patent on color Doppler imaging with reversal of flow away from the liver parenchyma. Other: Moderate volume ascites IMPRESSION: Stigmata of cirrhosis with a nodular hepatic surface contour, reversal of the portal venous flow and moderate ascites. Diffusely increased hepatic echogenicity compatible with hepatic steatosis. Mild gallbladder distention with gallstones and sludging. Mild wall thickening is nonspecific in the setting of ascites and intrinsic liver disease. Sonographic Eulah PontMurphy sign is negative, therefore findings remain equivocal for acute cholecystitis. Correlate with clinical exam findings. Electronically Signed   By: Kreg ShropshirePrice  DeHay M.D.   On: 05/15/2019 03:46    Pending Labs Unresulted Labs (From admission, onward)    Start     Ordered    05/15/19 0425  HIV Antibody  (Routine Testing)  Once,   STAT     05/15/19 0426   05/15/19 0423  Hepatic function panel  Once,   STAT     05/15/19 0426   05/15/19 0156  Hepatitis panel, acute  Once,   STAT     05/15/19 0155   05/15/19 0156  Urinalysis, Complete w Microscopic  ONCE - STAT,   STAT     05/15/19 0155   05/15/19 0156  Urine Drug Screen, Qualitative  Once,   STAT     05/15/19 0155   05/15/19 0155  Lactic acid, plasma  Now then every 2 hours,   STAT     05/15/19 0155          Vitals/Pain Today's Vitals   05/15/19 0200 05/15/19 0201 05/15/19 0230  BP: 108/79  105/86  Pulse: 99  88  Resp: (!) 21  (!) 22  Temp: 98.4 F (36.9 C)    TempSrc: Oral    SpO2: 96%  97%  Weight:  84.4 kg   Height:  5\' 4"  (1.626 m)   PainSc:  2      Isolation Precautions No active isolations  Medications Medications  metoprolol tartrate (LOPRESSOR) tablet 25 mg (has no administration in time range)  fluticasone (FLONASE) 50 MCG/ACT nasal spray 2 spray (has no administration in time range)  multivitamin with minerals tablet 1 tablet (has no administration in time range)  levocetirizine (XYZAL) tablet 5 mg (has no administration in time range)    Mobility walks Low fall risk   Focused Assessments Genitourinary   R Recommendations: See Admitting Provider Note  Report given to:   Additional Notes:

## 2019-05-15 NOTE — ED Triage Notes (Signed)
Pt in with co abd tightness for over a week, and wife noticed his skin was yellow tonight. Pt complaining of dark urine and urinary frequency. Pt does not have hx of liver problems but was an alcoholic, last drink was 3 weeks ago.

## 2019-05-16 DIAGNOSIS — K7031 Alcoholic cirrhosis of liver with ascites: Principal | ICD-10-CM

## 2019-05-16 LAB — BASIC METABOLIC PANEL
Anion gap: 13 (ref 5–15)
BUN: 66 mg/dL — ABNORMAL HIGH (ref 6–20)
CO2: 22 mmol/L (ref 22–32)
Calcium: 7.8 mg/dL — ABNORMAL LOW (ref 8.9–10.3)
Chloride: 99 mmol/L (ref 98–111)
Creatinine, Ser: 1.95 mg/dL — ABNORMAL HIGH (ref 0.61–1.24)
GFR calc Af Amer: 43 mL/min — ABNORMAL LOW (ref >60)
GFR calc non Af Amer: 37 mL/min — ABNORMAL LOW (ref >60)
Glucose, Bld: 172 mg/dL — ABNORMAL HIGH (ref 70–99)
Potassium: 3.7 mmol/L (ref 3.5–5.1)
Sodium: 134 mmol/L — ABNORMAL LOW (ref 135–145)

## 2019-05-16 LAB — CBC
HCT: 28.3 % — ABNORMAL LOW (ref 39.0–52.0)
Hemoglobin: 10.1 g/dL — ABNORMAL LOW (ref 13.0–17.0)
MCH: 34.6 pg — ABNORMAL HIGH (ref 26.0–34.0)
MCHC: 35.7 g/dL (ref 30.0–36.0)
MCV: 96.9 fL (ref 80.0–100.0)
Platelets: 136 10*3/uL — ABNORMAL LOW (ref 150–400)
RBC: 2.92 MIL/uL — ABNORMAL LOW (ref 4.22–5.81)
RDW: 13.9 % (ref 11.5–15.5)
WBC: 8.6 10*3/uL (ref 4.0–10.5)
nRBC: 0 % (ref 0.0–0.2)

## 2019-05-16 LAB — HEPATIC FUNCTION PANEL
ALT: 38 U/L (ref 0–44)
AST: 98 U/L — ABNORMAL HIGH (ref 15–41)
Albumin: 1.8 g/dL — ABNORMAL LOW (ref 3.5–5.0)
Alkaline Phosphatase: 102 U/L (ref 38–126)
Bilirubin, Direct: 10.6 mg/dL — ABNORMAL HIGH (ref 0.0–0.2)
Indirect Bilirubin: 7.4 mg/dL — ABNORMAL HIGH (ref 0.3–0.9)
Total Bilirubin: 18 mg/dL — ABNORMAL HIGH (ref 0.3–1.2)
Total Protein: 6.5 g/dL (ref 6.5–8.1)

## 2019-05-16 LAB — ALBUMIN, FLUID (OTHER): Albumin, Body Fluid Other: <0.2 g/dL

## 2019-05-16 LAB — HEPATITIS PANEL, ACUTE
HCV Ab: 0.2 s/co ratio — AB (ref 0.0–0.9)
Hep A IgM: NEGATIVE — AB
Hep B C IgM: NEGATIVE — AB
Hepatitis B Surface Ag: NEGATIVE — AB

## 2019-05-16 LAB — PROTEIN, BODY FLUID (OTHER): Total Protein, Body Fluid Other: 0.9 g/dL

## 2019-05-16 LAB — AMMONIA: Ammonia: 31 umol/L (ref 9–35)

## 2019-05-16 LAB — HIV ANTIBODY (ROUTINE TESTING W REFLEX): HIV Screen 4th Generation wRfx: NONREACTIVE — AB

## 2019-05-16 MED ORDER — LACTATED RINGERS IV BOLUS
500.0000 mL | Freq: Once | INTRAVENOUS | Status: AC
  Administered 2019-05-16: 18:00:00 500 mL via INTRAVENOUS

## 2019-05-16 MED ORDER — PREDNISOLONE SODIUM PHOSPHATE 15 MG/5ML PO SOLN
40.0000 mg | Freq: Every day | ORAL | Status: DC
  Administered 2019-05-17 – 2019-05-18 (×2): 40 mg via ORAL
  Filled 2019-05-16: qty 15
  Filled 2019-05-16: qty 13.33
  Filled 2019-05-16: qty 15

## 2019-05-16 MED ORDER — RIFAXIMIN 550 MG PO TABS
550.0000 mg | ORAL_TABLET | Freq: Two times a day (BID) | ORAL | Status: DC
  Administered 2019-05-16 – 2019-05-18 (×4): 550 mg via ORAL
  Filled 2019-05-16 (×4): qty 1

## 2019-05-16 NOTE — Progress Notes (Addendum)
[facility] transfer process started per GI request. Waiting for call back. Transplant Hepatologist Dr Rayvon Char called back requesting GI dr Marius Ditch to call them to discuss case before they would consider acceptance.

## 2019-05-16 NOTE — Progress Notes (Signed)
Arlyss Repress, MD [address]  [address]  [place], [place] [address]  Main: [phone]  Fax: [phone] Pager: [phone]   Subjective: Patient is much more alert and oriented today.  He had several loose nonbloody bowel movements since yesterday after receiving lactulose.  He feels tired.  He denies abdominal pain, nausea or vomiting.  His wife is bedside   Objective: Vital signs in last 24 hours: Vitals:   05/16/19 0404 05/16/19 0846 05/16/19 0957 05/16/19 1539  BP: 102/69 117/80  114/83  Pulse: 88 93  79  Resp: 17 18  18   Temp: 98.4 F (36.9 C) (!) 96.7 F (35.9 C) 97.6 F (36.4 C) (!) 97.5 F (36.4 C)  TempSrc: Oral Axillary Oral Oral  SpO2: 96% 95%  100%  Weight:      Height:       Weight change:   Intake/Output Summary (Last 24 hours) at 05/16/2019 1643 Last data filed at 05/16/2019 1300 Gross per 24 hour  Intake 1321.43 ml  Output --  Net 1321.43 ml     Exam: Heart:: Regular rate and rhythm, S1S2 present or without murmur or extra heart sounds Lungs: normal and clear to auscultation Abdomen: Soft, nontender, mildly diffusely distended   Lab Results: CBC Latest Ref Rng & Units 05/16/2019 05/15/2019 05/15/2019  WBC 4.0 - 10.5 K/uL 8.6 13.9(H) 14.2(H)  Hemoglobin 13.0 - 17.0 g/dL 10.1(L) 13.2 13.3  Hematocrit 39.0 - 52.0 % 28.3(L) 36.7(L) 37.6(L)  Platelets 150 - 400 K/uL 136(L) 160 177   CMP Latest Ref Rng & Units 05/16/2019 05/15/2019 05/15/2019  Glucose 70 - 99 mg/dL 05/17/2019) 78 -  BUN 6 - 20 mg/dL 939(Q) 30(S) -  Creatinine 0.61 - 1.24 mg/dL 92(Z) 3.00(T) -  Sodium 135 - 145 mmol/L 134(L) 132(L) -  Potassium 3.5 - 5.1 mmol/L 3.7 3.4(L) -  Chloride 98 - 111 mmol/L 99 96(L) -  CO2 22 - 32 mmol/L 22 21(L) -  Calcium 8.9 - 10.3 mg/dL 7.8(L) 7.8(L) -  Total Protein 6.5 - 8.1 g/dL 6.5 6.8 6.22(Q)  Total Bilirubin 0.3 - 1.2 mg/dL 18.0(H) 17.4(H) 15.5(H)  Alkaline Phos 38 - 126 U/L 102 128(H) 118  AST 15 - 41 U/L 98(H) 120(H) 116(H)  ALT 0 -  44 U/L 38 48(H) 43    Micro Results: Recent Results (from the past 240 hour(s))  SARS Coronavirus 2 [facility] order, Performed in [facility] hospital lab) Nasopharyngeal Nasopharyngeal Swab     Status: None   Collection Time: 05/15/19  2:11 AM   Specimen: Nasopharyngeal Swab  Result Value Ref Range Status   SARS Coronavirus 2 NEGATIVE NEGATIVE Final    Comment: (NOTE) If result is NEGATIVE SARS-CoV-2 target nucleic acids are NOT DETECTED. The SARS-CoV-2 RNA is generally detectable in upper and lower  respiratory specimens during the acute phase of infection. The lowest  concentration of SARS-CoV-2 viral copies this assay can detect is 250  copies / mL. A negative result does not preclude SARS-CoV-2 infection  and should not be used as the sole basis for treatment or other  patient management decisions.  A negative result may occur with  improper specimen collection / handling, submission of specimen other  than nasopharyngeal swab, presence of viral mutation(s) within the  areas targeted by this assay, and inadequate number of viral copies  (<250 copies / mL). A negative result must be combined with clinical  observations, patient history, and epidemiological information. If result is POSITIVE SARS-CoV-2 target nucleic  acids are DETECTED. The SARS-CoV-2 RNA is generally detectable in upper and lower  respiratory specimens dur ing the acute phase of infection.  Positive  results are indicative of active infection with SARS-CoV-2.  Clinical  correlation with patient history and other diagnostic information is  necessary to determine patient infection status.  Positive results do  not rule out bacterial infection or co-infection with other viruses. If result is PRESUMPTIVE POSTIVE SARS-CoV-2 nucleic acids MAY BE PRESENT.   A presumptive positive result was obtained on the submitted specimen  and confirmed on repeat testing.  While 2019 novel coronavirus  (SARS-CoV-2) nucleic acids  may be present in the submitted sample  additional confirmatory testing may be necessary for epidemiological  and / or clinical management purposes  to differentiate between  SARS-CoV-2 and other Sarbecovirus currently known to infect humans.  If clinically indicated additional testing with an alternate test  methodology [phone]) is advised. The SARS-CoV-2 RNA is generally  detectable in upper and lower respiratory sp ecimens during the acute  phase of infection. The expected result is Negative. Fact Sheet for Patients:  BoilerBrush.com.cy Fact Sheet for Healthcare Providers: https://pope.com/ This test is not yet approved or cleared by the [place] FDA and has been authorized for detection and/or diagnosis of SARS-CoV-2 by FDA under an Emergency Use Authorization (EUA).  This EUA will remain in effect (meaning this test can be used) for the duration of the COVID-19 declaration under Section 564(b)(1) of the Act, 21 U.S.C. section 360bbb-3(b)(1), unless the authorization is terminated or revoked sooner. Performed at [facility], [address] [place], [place] [address]   Body fluid culture     Status: None (Preliminary result)   Collection Time: 05/15/19 10:06 AM   Specimen: PATH Cytology Peritoneal fluid  Result Value Ref Range Status   Specimen Description   Final    PERITONEAL Performed at [facility], [address] [place], [place] [address]    Special Requests   Final    NONE Performed at [facility], [address] [place], [place] [address]    Gram Stain   Final    WBC PRESENT,BOTH PMN AND MONONUCLEAR NO ORGANISMS SEEN CYTOSPIN SMEAR    Culture   Final    NO GROWTH < 24 HOURS Performed at [facility], [address] [place], [place] [address]    Report Status PENDING  Incomplete  Culture, blood (routine x 2)     Status: None (Preliminary result)   Collection Time:  05/15/19  3:13 PM   Specimen: BLOOD  Result Value Ref Range Status   Specimen Description BLOOD LEFT ANTECUBITAL  Final   Special Requests   Final    BOTTLES DRAWN AEROBIC AND ANAEROBIC Blood Culture results may not be optimal due to an inadequate volume of blood received in culture bottles   Culture   Final    NO GROWTH < 24 HOURS Performed at [facility], [address], [place], [place] [address]    Report Status PENDING  Incomplete  Culture, blood (routine x 2)     Status: None (Preliminary result)   Collection Time: 05/15/19  3:21 PM   Specimen: BLOOD  Result Value Ref Range Status   Specimen Description BLOOD BLOOD LEFT HAND  Final   Special Requests   Final    BOTTLES DRAWN AEROBIC AND ANAEROBIC Blood Culture adequate volume   Culture   Final    NO GROWTH < 24 HOURS Performed  at [facility], [address], [place], [place] [address]    Report Status PENDING  Incomplete   Studies/Results: US Paracentesis  Result Date: 05/15/2019 INDICATION: Ascites EXAM: ULTRASOUND GUIDED  PARACENTESIS MEDICATIONS: None. COMPLICATIONS: None immediate. PROCEDURE: Informed written consent was obtained from the patient after a discussion of the risks, benefits and alternatives to treatment. A timeout was performed prior to the initiation of the procedure. Initial ultrasound scanning demonstrates a large amount of ascites within the right lower abdominal quadrant. The right lower abdomen was prepped and draped in the usual sterile fashion. 1% lidocaine was used for local anesthesia. Following this, a 6 French catheter was introduced. An ultrasound image was saved for documentation purposes. The paracentesis was performed. The catheter was removed and a dressing was applied. The patient tolerated the procedure well without immediate post procedural complication. Patient received post-procedure intravenous albumin; see nursing notes for details. FINDINGS: A total of approximately 2 L  of dark yellow fluid was removed. Samples were sent to the laboratory as requested by the clinical team. IMPRESSION: Successful ultrasound-guided paracentesis yielding 2 liters of peritoneal fluid. Electronically Signed   By: Marcello Moores  Register   On: 05/15/2019 10:11   Dg Chest Port 1 View  Result Date: 05/15/2019 CLINICAL DATA:  58 year old male with jaundice, weakness. EXAM: PORTABLE CHEST 1 VIEW COMPARISON:  Chest radiographs 12/16/2016 and earlier. FINDINGS: Portable AP upright view at 0206 hours. Lordotic positioning. Lower lung volumes. Stable cardiac and mediastinal contours. No pneumothorax, pulmonary edema, definite pleural effusion or acute pulmonary opacity. Paucity of bowel gas in the abdomen with hazy density instead, raising the possibility of ascites. No acute osseous abnormality identified. IMPRESSION: 1. Portable chest with lordotic positioning. No definite acute cardiopulmonary abnormality. 2. Paucity of bowel gas in the abdomen, consider ascites. Electronically Signed   By: Genevie Ann M.D.   On: 05/15/2019 02:21   [place] Ascites (abdomen Limited)  Result Date: 05/15/2019 CLINICAL DATA:  Abdominal distension EXAM: LIMITED ABDOMEN ULTRASOUND FOR ASCITES TECHNIQUE: Limited ultrasound survey for ascites was performed in all four abdominal quadrants. COMPARISON:  Same day right upper quadrant ultrasound. Abdominal ultrasound January 30, 2019 FINDINGS: A moderate volume ascites is present in all 4 abdominal quadrants. Nodular hepatic surface contour compatible with intrinsic liver disease. IMPRESSION: Moderate volume of abdominal ascites present in all 4 abdominal quadrants. Electronically Signed   By: Lovena Le M.D.   On: 05/15/2019 03:48   US Abdomen Limited Ruq  Result Date: 05/15/2019 CLINICAL DATA:  Jaundice, hepatomegaly EXAM: ULTRASOUND ABDOMEN LIMITED RIGHT UPPER QUADRANT COMPARISON:  Ultrasound 01/30/2020 FINDINGS: Gallbladder: There is mild gallbladder distention with a mixture of biliary  sludge and gallstones layering dependently within the gallbladder. Largest calculus measures up to 6 mm in size. Gallbladder wall is borderline thickened. Sonographic Percell Miller sign is reportedly negative. Common bile duct: Diameter: 5.1 mm, within normal limits. Liver: Diffusely increased hepatic echogenicity with loss of definition of the portal triads and diminished posterior through transmission compatible with hepatic steatosis. Nodular hepatic surface contour. Portal vein is patent on color Doppler imaging with reversal of flow away from the liver parenchyma. Other: Moderate volume ascites IMPRESSION: Stigmata of cirrhosis with a nodular hepatic surface contour, reversal of the portal venous flow and moderate ascites. Diffusely increased hepatic echogenicity compatible with hepatic steatosis. Mild gallbladder distention with gallstones and sludging. Mild wall thickening is nonspecific in the setting of ascites and intrinsic liver disease. Sonographic Percell Miller sign is negative, therefore findings remain equivocal for acute cholecystitis. Correlate  with clinical exam findings. Electronically Signed   By: Kreg ShropshirePrice  DeHay M.D.   On: 05/15/2019 03:46   Medications:  I have reviewed the patient's current medications. Prior to Admission:  Medications Prior to Admission  Medication Sig Dispense Refill Last Dose   fluticasone (FLONASE) 50 MCG/ACT nasal spray Place 2 sprays into both nostrils daily as needed.   prn at prn   levocetirizine (XYZAL) 5 MG tablet Take 5 mg by mouth daily.   05/14/2019 at Unknown time   metoprolol tartrate (LOPRESSOR) 25 MG tablet Take 1 tablet by mouth 2 (two) times daily.   05/14/2019 at Unknown time   Multiple Vitamin (MULTIVITAMIN WITH MINERALS) TABS tablet Take 1 tablet by mouth daily.   05/14/2019 at Unknown time   ondansetron (ZOFRAN) 8 MG tablet Take 1 tablet by mouth every 8 (eight) hours as needed.   prn at prn   Scheduled:  feeding supplement  1 Container Oral TID BM    folic acid  1 mg Oral Daily   loratadine  10 mg Oral Daily   metoprolol tartrate  25 mg Oral BID   multivitamin with minerals  1 tablet Oral Daily   pneumococcal 23 valent vaccine  0.5 mL Intramuscular Tomorrow-1000   [START ON 05/17/2019] prednisoLONE  40 mg Oral QAC breakfast   rifaximin  550 mg Oral BID   thiamine  100 mg Oral Daily   Continuous:  cefTRIAXone (ROCEPHIN)  IV 1 g (05/15/19 2032)   lactated ringers     WUJ:[id number]:fluticasone, LORazepam **OR** LORazepam Anti-infectives (From admission, onward)   Start     Dose/Rate Route Frequency Ordered Stop   05/16/19 2200  rifaximin (XIFAXAN) tablet 550 mg     550 mg Oral 2 times daily 05/16/19 1627     05/15/19 0530  cefTRIAXone (ROCEPHIN) 1 g in sodium chloride 0.9 % 100 mL IVPB     1 g 200 mL/hr over 30 Minutes Intravenous Every 24 hours 05/15/19 0505       Scheduled Meds:  feeding supplement  1 Container Oral TID BM   folic acid  1 mg Oral Daily   loratadine  10 mg Oral Daily   metoprolol tartrate  25 mg Oral BID   multivitamin with minerals  1 tablet Oral Daily   pneumococcal 23 valent vaccine  0.5 mL Intramuscular Tomorrow-1000   [START ON 05/17/2019] prednisoLONE  40 mg Oral QAC breakfast   rifaximin  550 mg Oral BID   thiamine  100 mg Oral Daily   Continuous Infusions:  cefTRIAXone (ROCEPHIN)  IV 1 g (05/15/19 2032)   lactated ringers     PRN Meds:.fluticasone, LORazepam **OR** LORazepam   Assessment: Active Problems:   Ascites due to alcoholic cirrhosis (HCC)  Acute on chronic alcoholic liver failure SAAG > 1.1 consistent with portal hypertension  Plan: Acute on chronic alcoholic liver failure: Transaminases are improving, but T bili still very high Acute viral hepatitis panel negative for hepatitis A, B and C Blood cultures no growth to date Continue ceftriaxone for SBP prophylaxis Decrease prednisone to 40 mg daily, day 2.  Calculate Lillie score on day 7 of steroids in deciding whether  to continue treatment. A score >0.45 suggests that a patient is not responding to glucocorticoid therapy.  No signs of active GI bleed Monitor daily LFTs Switch from lactulose to rifaximin as patient had several episodes of diarrhea which can lead to dehydration and worsening of renal function Continue multivitamin plus thiamine plus folate Encourage p.o. nutrition  and protein supplements 3 times a day We will try to transfer to tertiary care center with liver transplant availability, discussed with Dr. Sherryll Burger  Acute kidney injury Continue 25% IV albumin 2 times daily Administer 500 mL of LR bolus Recommend nephrology consult, may benefit from midodrine  Overall prognosis is guarded, discussed in detail with both patient and his wife GI will continue to follow along with you    LOS: 1 day   Bevin Mayall 05/16/2019, 4:43 PM

## 2019-05-16 NOTE — Progress Notes (Signed)
Melvin Stone NAME: Melvin Stone    MR#:  [id number]  DATE OF BIRTH:  04-29-1961  SUBJECTIVE:  CHIEF COMPLAINT:   Chief Complaint  Patient presents with  . Abdominal Pain  Continues to feel tired, status post paracentesis under IR yesterday with 2 L of fluid removal, abdominal pain + REVIEW OF SYSTEMS:  Review of Systems  Constitutional: Positive for malaise/fatigue. Negative for diaphoresis, fever and weight loss.  HENT: Negative for ear discharge, ear pain, hearing loss, nosebleeds, sore throat and tinnitus.   Eyes: Negative for blurred vision and pain.  Respiratory: Positive for shortness of breath. Negative for cough, hemoptysis and wheezing.   Cardiovascular: Negative for chest pain, palpitations, orthopnea and leg swelling.  Gastrointestinal: Positive for abdominal pain. Negative for blood in stool, constipation, diarrhea, heartburn, nausea and vomiting.  Genitourinary: Negative for dysuria, frequency and urgency.  Musculoskeletal: Negative for back pain and myalgias.  Skin: Negative for itching and rash.  Neurological: Negative for dizziness, tingling, tremors, focal weakness, seizures, weakness and headaches.  Psychiatric/Behavioral: Negative for depression. The patient is not nervous/anxious.     DRUG ALLERGIES:   Allergies  Allergen Reactions  . Other    VITALS:  Blood pressure 117/80, pulse 93, temperature 97.6 F (36.4 C), temperature source Oral, resp. rate 18, height 5\' 4"  (1.626 m), weight 96 kg, SpO2 95 %. PHYSICAL EXAMINATION:  Physical Exam Constitutional:      Appearance: He is obese. He is ill-appearing.  HENT:     Head: Normocephalic and atraumatic.  Eyes:     General: Scleral icterus present.     Conjunctiva/sclera: Conjunctivae normal.     Pupils: Pupils are equal, round, and reactive to light.  Neck:     Musculoskeletal: Normal range of motion and neck supple.     Thyroid: No  thyromegaly.     Trachea: No tracheal deviation.  Cardiovascular:     Rate and Rhythm: Normal rate and regular rhythm.     Heart sounds: Normal heart sounds.  Pulmonary:     Effort: Pulmonary effort is normal. No respiratory distress.     Breath sounds: Normal breath sounds. No wheezing.  Chest:     Chest wall: No tenderness.  Abdominal:     General: Bowel sounds are normal. There is no distension.     Palpations: There is shifting dullness and fluid wave.     Tenderness: There is no abdominal tenderness.  Musculoskeletal: Normal range of motion.  Skin:    General: Skin is warm and dry.     Findings: No rash.  Neurological:     Mental Status: He is alert and oriented to person, place, and time.     Cranial Nerves: No cranial nerve deficit.    LABORATORY PANEL:  Male CBC Recent Labs  Lab 05/16/19 0559  WBC 8.6  HGB 10.1*  HCT 28.3*  PLT 136*   ------------------------------------------------------------------------------------------------------------------ Chemistries  Recent Labs  Lab 05/15/19 0557 05/16/19 0559 05/16/19 0918  NA 132* 134*  --   K 3.4* 3.7  --   CL 96* 99  --   CO2 21* 22  --   GLUCOSE 78 172*  --   BUN 64* 66*  --   CREATININE 1.96* 1.95*  --   CALCIUM 7.8* 7.8*  --   MG 1.8  --   --   AST 120*  --  98*  ALT 48*  --  38  ALKPHOS 128*  --  102  BILITOT 17.4*  --  18.0*   RADIOLOGY:  No results found. ASSESSMENT AND PLAN:  58 y.o.malewith pertinent past medical history of EtOH abuse, tremors, ataxia, paroxysmal atrial fibrillation, alcoholic hepatitis, hypertension, OSA, diabetes mellitus, obesity status post gastric bypass admitted for abdominal tightness, shortness of breath and generalized fatigue  1. Alcoholic cirrhosis -patient presenting with jaundice, icterus, elevated LFTs and bilirubin levels, coagulopathy with elevated INR  -Ammonia level is trending down - His LFTs are consistent with severe alcoholic hepatitis -According  to GI his 30-day mortality is >50% based on the score -Starting prednisolone 60 mg once daily per GI  - Complete abstinence from alcohol - High-protein diet, encourage p.o. nutrition - Dietitian consult - Hepatitis panel is negative - GI input appreciated  2. Ascites due to alcoholic cirrhosis -Status post IR paracentesis with removal of 2 L of fluid on 9/29 - SBP Prophylaxis with Ceftriaxone 1g IV daily   3. AKI - Hepatorenal  - Hold nephrotoxins, renal function remains stable -We will consider nephrology consult if albumin challenge needed to confirm not pre-renal  - Continue to monitor renal functions  4. Hyperammonemia -secondary to above, no evidence of hepatic encephalopathy - Lactulose as above, last ammonia of 66->31 - Continue to trend ammonia levels  5. Paroxysmal atrial fibrillation -rate controlled on metoprolol - Currently not on anticoagulation, may be high risk for bleed  6. Hypertension -continue metoprolol  7. EtOH Abuse - Last Drink 3 weeks ago - Daily Thiamine, Folate, MVI  - SW consult for cessation resources - PT/OT evaluation for mobility - CIWA +/- Standing Protocol   8. DVT prophylaxis - Hold anti-coagulation for pending procedure    We will get palliative care evaluation goals of care discussion   All the records are reviewed and case discussed with Care Management/Social Worker. Management plans discussed with the patient, family (discussed with wife over phone) and they are in agreement.  CODE STATUS: Full Code  TOTAL TIME TAKING CARE OF THIS PATIENT: 35 minutes.   More than 50% of the time was spent in counseling/coordination of care: YES  POSSIBLE D/C IN 1-2 DAYS, DEPENDING ON CLINICAL CONDITION.   Melvin Stone M.D on 05/16/2019 at 12:22 PM  Between 7am to 6pm - Pager - [phone]  After 6pm go to [email or web address] - Scientist, research (life sciences) [facility]  Office  [phone]  CC: Primary care  physician; Lauro Regulus, MD  Note: This dictation was prepared with Dragon dictation along with smaller phrase technology. Any transcriptional errors that result from this process are unintentional.

## 2019-05-17 DIAGNOSIS — R140 Abdominal distension (gaseous): Secondary | ICD-10-CM

## 2019-05-17 DIAGNOSIS — K7200 Acute and subacute hepatic failure without coma: Secondary | ICD-10-CM

## 2019-05-17 DIAGNOSIS — Z515 Encounter for palliative care: Secondary | ICD-10-CM

## 2019-05-17 DIAGNOSIS — R531 Weakness: Secondary | ICD-10-CM

## 2019-05-17 DIAGNOSIS — Z7189 Other specified counseling: Secondary | ICD-10-CM

## 2019-05-17 LAB — COMPREHENSIVE METABOLIC PANEL
ALT: 34 U/L (ref 0–44)
AST: 74 U/L — ABNORMAL HIGH (ref 15–41)
Albumin: 1.8 g/dL — ABNORMAL LOW (ref 3.5–5.0)
Alkaline Phosphatase: 99 U/L (ref 38–126)
Anion gap: 10 (ref 5–15)
BUN: 67 mg/dL — ABNORMAL HIGH (ref 6–20)
CO2: 21 mmol/L — ABNORMAL LOW (ref 22–32)
Calcium: 7.9 mg/dL — ABNORMAL LOW (ref 8.9–10.3)
Chloride: 101 mmol/L (ref 98–111)
Creatinine, Ser: 1.94 mg/dL — ABNORMAL HIGH (ref 0.61–1.24)
GFR calc Af Amer: 43 mL/min — ABNORMAL LOW (ref >60)
GFR calc non Af Amer: 37 mL/min — ABNORMAL LOW (ref >60)
Glucose, Bld: 115 mg/dL — ABNORMAL HIGH (ref 70–99)
Potassium: 3.1 mmol/L — ABNORMAL LOW (ref 3.5–5.1)
Sodium: 132 mmol/L — ABNORMAL LOW (ref 135–145)
Total Bilirubin: 15.1 mg/dL — ABNORMAL HIGH (ref 0.3–1.2)
Total Protein: 6 g/dL — ABNORMAL LOW (ref 6.5–8.1)

## 2019-05-17 LAB — CBC
HCT: 29 % — ABNORMAL LOW (ref 39.0–52.0)
Hemoglobin: 10.3 g/dL — ABNORMAL LOW (ref 13.0–17.0)
MCH: 34.7 pg — ABNORMAL HIGH (ref 26.0–34.0)
MCHC: 35.5 g/dL (ref 30.0–36.0)
MCV: 97.6 fL (ref 80.0–100.0)
Platelets: 148 10*3/uL — ABNORMAL LOW (ref 150–400)
RBC: 2.97 MIL/uL — ABNORMAL LOW (ref 4.22–5.81)
RDW: 14 % (ref 11.5–15.5)
WBC: 16.1 10*3/uL — ABNORMAL HIGH (ref 4.0–10.5)
nRBC: 0 % (ref 0.0–0.2)

## 2019-05-17 LAB — AMMONIA: Ammonia: 47 umol/L — ABNORMAL HIGH (ref 9–35)

## 2019-05-17 MED ORDER — POTASSIUM CHLORIDE CRYS ER 20 MEQ PO TBCR
20.0000 meq | EXTENDED_RELEASE_TABLET | Freq: Once | ORAL | Status: AC
  Administered 2019-05-17: 20 meq via ORAL
  Filled 2019-05-17: qty 1

## 2019-05-17 MED ORDER — MIDODRINE HCL 5 MG PO TABS
10.0000 mg | ORAL_TABLET | Freq: Three times a day (TID) | ORAL | Status: DC
  Administered 2019-05-17 – 2019-05-18 (×5): 10 mg via ORAL
  Filled 2019-05-17 (×5): qty 2

## 2019-05-17 MED ORDER — ALBUMIN HUMAN 5 % IV SOLN
25.0000 g | Freq: Two times a day (BID) | INTRAVENOUS | Status: DC
  Administered 2019-05-17: 15:00:00 25 g via INTRAVENOUS
  Administered 2019-05-17: 12.5 g via INTRAVENOUS
  Administered 2019-05-18: 25 g via INTRAVENOUS
  Administered 2019-05-18: 12.5 g via INTRAVENOUS
  Filled 2019-05-17 (×4): qty 500

## 2019-05-17 MED ORDER — OCTREOTIDE ACETATE 100 MCG/ML IJ SOLN
200.0000 ug | Freq: Three times a day (TID) | INTRAMUSCULAR | Status: DC
  Administered 2019-05-17 – 2019-05-18 (×5): 200 ug via SUBCUTANEOUS
  Filled 2019-05-17 (×6): qty 2

## 2019-05-17 NOTE — Progress Notes (Signed)
Arlyss Repress, MD [address]  [address]  [place], [place] [address]  Main: [phone]  Fax: [phone] Pager: [phone]   Subjective: Patient did not sleep well last night.  He reports the diarrhea has improved.  He is not able to eat full portion due to history of gastric bypass.  Wife is bedside.  Patient denies fever, chills, nausea or vomiting   Objective: Vital signs in last 24 hours: Vitals:   05/16/19 0957 05/16/19 1539 05/16/19 1943 05/17/19 0347  BP:  114/83 120/87 113/81  Pulse:  79 83 77  Resp:  18 20 16   Temp: 97.6 F (36.4 C) (!) 97.5 F (36.4 C) 97.9 F (36.6 C) 98.1 F (36.7 C)  TempSrc: Oral Oral  Oral  SpO2:  100% 97% 97%  Weight:      Height:       Weight change:   Intake/Output Summary (Last 24 hours) at 05/17/2019 1247 Last data filed at 05/17/2019 1118 Gross per 24 hour  Intake 480 ml  Output 125 ml  Net 355 ml     Exam: Heart:: Regular rate and rhythm, S1S2 present or without murmur or extra heart sounds Lungs: normal and clear to auscultation Abdomen: Soft, nontender, mildly diffusely distended, redundant skin from loss of fat from gastric bypass   Lab Results: CBC Latest Ref Rng & Units 05/17/2019 05/16/2019 05/15/2019  WBC 4.0 - 10.5 K/uL 16.1(H) 8.6 13.9(H)  Hemoglobin 13.0 - 17.0 g/dL 10.3(L) 10.1(L) 13.2  Hematocrit 39.0 - 52.0 % 29.0(L) 28.3(L) 36.7(L)  Platelets 150 - 400 K/uL 148(L) 136(L) 160   CMP Latest Ref Rng & Units 05/17/2019 05/16/2019 05/15/2019  Glucose 70 - 99 mg/dL 05/17/2019) 992(E) 78  BUN 6 - 20 mg/dL 268(T) 41(D) 62(I)  Creatinine 0.61 - 1.24 mg/dL 29(N) 9.89(Q) 1.19(E)  Sodium 135 - 145 mmol/L 132(L) 134(L) 132(L)  Potassium 3.5 - 5.1 mmol/L 3.1(L) 3.7 3.4(L)  Chloride 98 - 111 mmol/L 101 99 96(L)  CO2 22 - 32 mmol/L 21(L) 22 21(L)  Calcium 8.9 - 10.3 mg/dL 7.9(L) 7.8(L) 7.8(L)  Total Protein 6.5 - 8.1 g/dL 6.0(L) 6.5 6.8  Total Bilirubin 0.3 - 1.2 mg/dL 15.1(H) 18.0(H) 17.4(H)  Alkaline Phos  38 - 126 U/L 99 102 128(H)  AST 15 - 41 U/L 74(H) 98(H) 120(H)  ALT 0 - 44 U/L 34 38 48(H)    Micro Results: Recent Results (from the past 240 hour(s))  SARS Coronavirus 2 [facility] order, Performed in [facility] hospital lab) Nasopharyngeal Nasopharyngeal Swab     Status: None   Collection Time: 05/15/19  2:11 AM   Specimen: Nasopharyngeal Swab  Result Value Ref Range Status   SARS Coronavirus 2 NEGATIVE NEGATIVE Final    Comment: (NOTE) If result is NEGATIVE SARS-CoV-2 target nucleic acids are NOT DETECTED. The SARS-CoV-2 RNA is generally detectable in upper and lower  respiratory specimens during the acute phase of infection. The lowest  concentration of SARS-CoV-2 viral copies this assay can detect is 250  copies / mL. A negative result does not preclude SARS-CoV-2 infection  and should not be used as the sole basis for treatment or other  patient management decisions.  A negative result may occur with  improper specimen collection / handling, submission of specimen other  than nasopharyngeal swab, presence of viral mutation(s) within the  areas targeted by this assay, and inadequate number of viral copies  (<250 copies / mL). A negative result must be combined with clinical  observations, patient history, and epidemiological information. If result is POSITIVE SARS-CoV-2 target nucleic acids are DETECTED. The SARS-CoV-2 RNA is generally detectable in upper and lower  respiratory specimens dur ing the acute phase of infection.  Positive  results are indicative of active infection with SARS-CoV-2.  Clinical  correlation with patient history and other diagnostic information is  necessary to determine patient infection status.  Positive results do  not rule out bacterial infection or co-infection with other viruses. If result is PRESUMPTIVE POSTIVE SARS-CoV-2 nucleic acids MAY BE PRESENT.   A presumptive positive result was obtained on the submitted specimen  and confirmed on  repeat testing.  While 2019 novel coronavirus  (SARS-CoV-2) nucleic acids may be present in the submitted sample  additional confirmatory testing may be necessary for epidemiological  and / or clinical management purposes  to differentiate between  SARS-CoV-2 and other Sarbecovirus currently known to infect humans.  If clinically indicated additional testing with an alternate test  methodology [phone]) is advised. The SARS-CoV-2 RNA is generally  detectable in upper and lower respiratory sp ecimens during the acute  phase of infection. The expected result is Negative. Fact Sheet for Patients:  BoilerBrush.com.cy Fact Sheet for Healthcare Providers: https://pope.com/ This test is not yet approved or cleared by the [place] FDA and has been authorized for detection and/or diagnosis of SARS-CoV-2 by FDA under an Emergency Use Authorization (EUA).  This EUA will remain in effect (meaning this test can be used) for the duration of the COVID-19 declaration under Section 564(b)(1) of the Act, 21 U.S.C. section 360bbb-3(b)(1), unless the authorization is terminated or revoked sooner. Performed at [facility], [address] [place], [place] [address]   Body fluid culture     Status: None (Preliminary result)   Collection Time: 05/15/19 10:06 AM   Specimen: PATH Cytology Peritoneal fluid  Result Value Ref Range Status   Specimen Description   Final    PERITONEAL Performed at [facility], [address], [place], [place] [address]    Special Requests   Final    NONE Performed at [facility], [address] [place], [place] [address]    Gram Stain   Final    WBC PRESENT,BOTH PMN AND MONONUCLEAR NO ORGANISMS SEEN CYTOSPIN SMEAR    Culture   Final    NO GROWTH 2 DAYS Performed at [facility], [address] [place], [place] [address]    Report Status PENDING  Incomplete  Culture, blood  (routine x 2)     Status: None (Preliminary result)   Collection Time: 05/15/19  3:13 PM   Specimen: BLOOD  Result Value Ref Range Status   Specimen Description BLOOD LEFT ANTECUBITAL  Final   Special Requests   Final    BOTTLES DRAWN AEROBIC AND ANAEROBIC Blood Culture results may not be optimal due to an inadequate volume of blood received in culture bottles   Culture   Final    NO GROWTH 2 DAYS Performed at [facility], [address] [place], [place] [address]    Report Status PENDING  Incomplete  Culture, blood (routine x 2)     Status: None (Preliminary result)   Collection Time: 05/15/19  3:21 PM   Specimen: BLOOD  Result Value Ref Range Status   Specimen Description BLOOD BLOOD LEFT HAND  Final   Special Requests   Final    BOTTLES DRAWN AEROBIC AND ANAEROBIC Blood Culture adequate volume   Culture  Final    NO GROWTH 2 DAYS Performed at [facility], [address] [place], [place] [address]    Report Status PENDING  Incomplete   Studies/Results: No results found. Medications:  I have reviewed the patient's current medications. Prior to Admission:  Medications Prior to Admission  Medication Sig Dispense Refill Last Dose  . fluticasone (FLONASE) 50 MCG/ACT nasal spray Place 2 sprays into both nostrils daily as needed.   prn at prn  . levocetirizine (XYZAL) 5 MG tablet Take 5 mg by mouth daily.   05/14/2019 at Unknown time  . metoprolol tartrate (LOPRESSOR) 25 MG tablet Take 1 tablet by mouth 2 (two) times daily.   05/14/2019 at Unknown time  . Multiple Vitamin (MULTIVITAMIN WITH MINERALS) TABS tablet Take 1 tablet by mouth daily.   05/14/2019 at Unknown time  . ondansetron (ZOFRAN) 8 MG tablet Take 1 tablet by mouth every 8 (eight) hours as needed.   prn at prn   Scheduled: . feeding supplement  1 Container Oral TID BM  . folic acid  1 mg Oral Daily  . loratadine  10 mg Oral Daily  . midodrine  10 mg Oral TID WC  . multivitamin with minerals  1  tablet Oral Daily  . octreotide  200 mcg Subcutaneous TID  . pneumococcal 23 valent vaccine  0.5 mL Intramuscular Tomorrow-1000  . prednisoLONE  40 mg Oral QAC breakfast  . rifaximin  550 mg Oral BID  . thiamine  100 mg Oral Daily   Continuous: . albumin human    . cefTRIAXone (ROCEPHIN)  IV 1 g (05/16/19 2204)   OZH:YQMVHQIONGEPRN:fluticasone, LORazepam **OR** LORazepam Anti-infectives (From admission, onward)   Start     Dose/Rate Route Frequency Ordered Stop   05/16/19 2200  rifaximin (XIFAXAN) tablet 550 mg     550 mg Oral 2 times daily 05/16/19 1627     05/15/19 0530  cefTRIAXone (ROCEPHIN) 1 g in sodium chloride 0.9 % 100 mL IVPB     1 g 200 mL/hr over 30 Minutes Intravenous Every 24 hours 05/15/19 0505       Scheduled Meds: . feeding supplement  1 Container Oral TID BM  . folic acid  1 mg Oral Daily  . loratadine  10 mg Oral Daily  . midodrine  10 mg Oral TID WC  . multivitamin with minerals  1 tablet Oral Daily  . octreotide  200 mcg Subcutaneous TID  . pneumococcal 23 valent vaccine  0.5 mL Intramuscular Tomorrow-1000  . prednisoLONE  40 mg Oral QAC breakfast  . rifaximin  550 mg Oral BID  . thiamine  100 mg Oral Daily   Continuous Infusions: . albumin human    . cefTRIAXone (ROCEPHIN)  IV 1 g (05/16/19 2204)   PRN Meds:.fluticasone, LORazepam **OR** LORazepam   Assessment: Active Problems:   Ascites due to alcoholic cirrhosis (HCC)  Acute on chronic alcoholic liver failure SAAG > 1.1 consistent with portal hypertension  Plan: Acute on chronic alcoholic liver failure: Transaminases are improving, but T bili still very high, slightly down to 15 today Acute viral hepatitis panel negative for hepatitis A, B and C Blood cultures no growth to date Continue ceftriaxone for SBP prophylaxis Decrease prednisone to 40 mg daily, day 2.  Calculate Lillie score on day 7 of steroids in deciding whether to continue treatment. A score >0.45 suggests that a patient is not responding to  glucocorticoid therapy.  No signs of active GI bleed Monitor daily LFTs Continue rifaximin  for hepatic encephalopathy Continue multivitamin plus thiamine plus folate Encourage p.o. nutrition and protein supplements 3 times a day Discussed with Dr. Drue Novel, transplant hepatologist at [facility] for transfer and patient is accepted.  Currently on wait list for transfer  Acute kidney injury/HRS Continue 25g IV albumin 2 times daily Start midodrine 10 mg 3 times daily Octreotide 226mcg subcutaneous 3 times daily Discontinued metoprolol Encourage p.o. hydration Strict I's and O's Nephrology is following   Overall prognosis is guarded, discussed in detail with both patient and his wife GI will continue to follow along with you until transfer    LOS: 2 days   Jovie Swanner 05/17/2019, 12:47 PM

## 2019-05-17 NOTE — Progress Notes (Signed)
[facility] [place] NAME: Melvin Stone    MR#:  [id number]  DATE OF BIRTH:  04/20/1961  SUBJECTIVE:  CHIEF COMPLAINT:   Chief Complaint  Patient presents with  . Abdominal Pain  Continues to feel tired, abdominal pain +, jaundice REVIEW OF SYSTEMS:  Review of Systems  Constitutional: Positive for malaise/fatigue. Negative for diaphoresis, fever and weight loss.  HENT: Negative for ear discharge, ear pain, hearing loss, nosebleeds, sore throat and tinnitus.   Eyes: Negative for blurred vision and pain.  Respiratory: Positive for shortness of breath. Negative for cough, hemoptysis and wheezing.   Cardiovascular: Negative for chest pain, palpitations, orthopnea and leg swelling.  Gastrointestinal: Positive for abdominal pain. Negative for blood in stool, constipation, diarrhea, heartburn, nausea and vomiting.  Genitourinary: Negative for dysuria, frequency and urgency.  Musculoskeletal: Negative for back pain and myalgias.  Skin: Negative for itching and rash.  Neurological: Negative for dizziness, tingling, tremors, focal weakness, seizures, weakness and headaches.  Psychiatric/Behavioral: Negative for depression. The patient is not nervous/anxious.    DRUG ALLERGIES:   Allergies  Allergen Reactions  . Other    VITALS:  Blood pressure 113/81, pulse 77, temperature 98.1 F (36.7 C), temperature source Oral, resp. rate 16, height 5\' 4"  (1.626 m), weight 96 kg, SpO2 97 %. PHYSICAL EXAMINATION:  Physical Exam Constitutional:      Appearance: He is obese. He is ill-appearing.  HENT:     Head: Normocephalic and atraumatic.  Eyes:     General: Scleral icterus present.     Conjunctiva/sclera: Conjunctivae normal.     Pupils: Pupils are equal, round, and reactive to light.  Neck:     Musculoskeletal: Normal range of motion and neck supple.     Thyroid: No thyromegaly.     Trachea: No tracheal deviation.  Cardiovascular:     Rate and Rhythm: Normal rate and regular rhythm.     Heart sounds: Normal heart sounds.  Pulmonary:     Effort: Pulmonary effort is normal. No respiratory distress.     Breath sounds: Normal breath sounds. No wheezing.  Chest:     Chest wall: No tenderness.  Abdominal:     General: Bowel sounds are normal. There is no distension.     Palpations: There is shifting dullness and fluid wave.     Tenderness: There is no abdominal tenderness.  Musculoskeletal: Normal range of motion.  Skin:    General: Skin is warm and dry.     Findings: No rash.  Neurological:     Mental Status: He is alert and oriented to person, place, and time.     Cranial Nerves: No cranial nerve deficit.    LABORATORY PANEL:  Male CBC Recent Labs  Lab 05/17/19 0330  WBC 16.1*  HGB 10.3*  HCT 29.0*  PLT 148*   ------------------------------------------------------------------------------------------------------------------ Chemistries  Recent Labs  Lab 05/15/19 0557  05/17/19 0330  NA 132*   < > 132*  K 3.4*   < > 3.1*  CL 96*   < > 101  CO2 21*   < > 21*  GLUCOSE 78   < > 115*  BUN 64*   < > 67*  CREATININE 1.96*   < > 1.94*  CALCIUM 7.8*   < > 7.9*  MG 1.8  --   --   AST 120*   < > 74*  ALT 48*   < > 34  ALKPHOS 128*   < > 99  BILITOT 17.4*   < > 15.1*   < > = values in this interval not displayed.   RADIOLOGY:  No results found. ASSESSMENT AND PLAN:  58 y.o.malewith pertinent past medical history of EtOH abuse, tremors, ataxia, paroxysmal atrial fibrillation, alcoholic hepatitis, hypertension, OSA, diabetes mellitus, obesity status post gastric bypass admitted for abdominal tightness, shortness of breath and generalized fatigue  1. Acute severe alcoholic liver disease-patient presenting with jaundice, icterus, elevated LFTs and bilirubin levels, coagulopathy with elevated INR  -Ammonia level is trending down, on rifaximin twice a day - His LFTs are consistent with severe alcoholic  hepatitis -According to GI his 30-day mortality is >50% based on the score -Continue prednisolone 60 mg once daily  -Start midodrine 10 mg 3 times daily, octreotide 200 mg subcut 3 times daily along with IV albumin due to worsening kidney function, goal to maintain MAPs>70 - Hepatitis panel is negative - GI input appreciated  2. Ascites due to alcoholic cirrhosis -Status post IR paracentesis with removal of 2 L of fluid on 9/29 - SBP Prophylaxis with Ceftriaxone 1g IV daily   3. AKI - Hepatorenal  - Hold nephrotoxins, renal function remains stable -nephrology consult per GI request - Continue to monitor renal functions  4. Hyperammonemia -secondary to above, no evidence of hepatic encephalopathy - Lactulose as above, last ammonia of 66->31 - Continue to trend ammonia levels  5. Paroxysmal atrial fibrillation -rate controlled on metoprolol - Currently not on anticoagulation, may be high risk for bleed  6. Hypertension -continue metoprolol  7. EtOH Abuse - Last Drink 3 weeks ago - Daily Thiamine, Folate, MVI  - SW consult for cessation resources - PT/OT evaluation for mobility - CIWA +/- Standing Protocol   8. DVT prophylaxis - Hold anti-coagulation for pending procedure    Dr. Woodfin Ganja, transplant hepatologist at [facility] has accepted the transfer after discussion with Dr. Allegra Lai this morning. I received call back from [facility] transfer center.  They do not have beds the patient is placed on the waiting list.   All the records are reviewed and case discussed with Care Management/Social Worker. Management plans discussed with the patient, nursing and they are in agreement.  CODE STATUS: Full Code  TOTAL TIME TAKING CARE OF THIS PATIENT: 35 minutes.   More than 50% of the time was spent in counseling/coordination of care: YES  POSSIBLE D/C IN 3-4 DAYS, DEPENDING ON CLINICAL CONDITION.   Delfino Lovett M.D on 05/17/2019 at 10:02 AM  Between 7am to 6pm - Pager -  [phone]  After 6pm go to [email or web address] - Scientist, research (life sciences) [facility]  Office  [phone]  CC: Primary care physician; Lauro Regulus, MD  Note: This dictation was prepared with Dragon dictation along with smaller phrase technology. Any transcriptional errors that result from this process are unintentional.

## 2019-05-17 NOTE — Consult Note (Signed)
[facility]  CONSULT NOTE    Date: 05/17/2019                  Patient Name:  Melvin Stone  MRN: [id number]  DOB: 15-Jul-1961  Age / Sex: 58 y.o., male         PCP: Lauro Regulus, MD                 Service Requesting Consult: Dr. Sherryll Burger                 Reason for Consult: Acute renal failure            History of Present Illness: Mr. Melvin Stone  Admitted on 9/29 with abdominal pain and weakness. Patient's wife is at bedside.   Paracentesis with 2 liters removed on 9/29. Transudative with no signs of SBP.   Reports daily consumption of alcohol.   Nephrology consulted for acute renal failure, creatinine 1.9.    Medications: Outpatient medications: Medications Prior to Admission  Medication Sig Dispense Refill Last Dose  . fluticasone (FLONASE) 50 MCG/ACT nasal spray Place 2 sprays into both nostrils daily as needed.   prn at prn  . levocetirizine (XYZAL) 5 MG tablet Take 5 mg by mouth daily.   05/14/2019 at Unknown time  . metoprolol tartrate (LOPRESSOR) 25 MG tablet Take 1 tablet by mouth 2 (two) times daily.   05/14/2019 at Unknown time  . Multiple Vitamin (MULTIVITAMIN WITH MINERALS) TABS tablet Take 1 tablet by mouth daily.   05/14/2019 at Unknown time  . ondansetron (ZOFRAN) 8 MG tablet Take 1 tablet by mouth every 8 (eight) hours as needed.   prn at prn    Current medications: Current Facility-Administered Medications  Medication Dose Route Frequency Provider Last Rate Last Dose  . cefTRIAXone (ROCEPHIN) 1 g in sodium chloride 0.9 % 100 mL IVPB  1 g Intravenous Q24H Jimmye Norman, NP 200 mL/hr at 05/16/19 2204 1 g at 05/16/19 2204  . feeding supplement (BOOST / RESOURCE BREEZE) liquid 1 Container  1 Container Oral TID BM Toney Reil, MD   1 Container at 05/16/19 2208  . fluticasone (FLONASE) 50 MCG/ACT nasal spray 2 spray  2 spray Each Nare Daily PRN Jimmye Norman, NP      . folic acid (FOLVITE) tablet 1 mg   1 mg Oral Daily Jimmye Norman, NP   1 mg at 05/16/19 1035  . loratadine (CLARITIN) tablet 10 mg  10 mg Oral Daily Valrie Hart A, RPH   10 mg at 05/16/19 1035  . LORazepam (ATIVAN) tablet 1-4 mg  1-4 mg Oral Q1H PRN Jimmye Norman, NP   1 mg at 05/15/19 1104   Or  . LORazepam (ATIVAN) injection 1-4 mg  1-4 mg Intravenous Q1H PRN Jimmye Norman, NP      . midodrine (PROAMATINE) tablet 10 mg  10 mg Oral TID WC Vanga, Loel Dubonnet, MD      . multivitamin with minerals tablet 1 tablet  1 tablet Oral Daily Jimmye Norman, NP   1 tablet at 05/16/19 1035  . octreotide (SANDOSTATIN) injection 200 mcg  200 mcg Subcutaneous TID Toney Reil, MD      . pneumococcal 23 valent vaccine (PNU-IMMUNE) injection 0.5 mL  0.5 mL Intramuscular Tomorrow-1000 Ouma, Hubbard Hartshorn, NP      . prednisoLONE (ORAPRED) 15 MG/5ML solution 40 mg  40 mg Oral QAC breakfast Vanga, Loel Dubonnet, MD  40 mg at 05/17/19 0630  . rifaximin (XIFAXAN) tablet 550 mg  550 mg Oral BID Toney ReilVanga, Rohini Reddy, MD   550 mg at 05/16/19 2207  . thiamine (VITAMIN B-1) tablet 100 mg  100 mg Oral Daily Jimmye Normanuma, Elizabeth Achieng, NP   100 mg at 05/16/19 1036      Allergies: Allergies  Allergen Reactions  . Other       Past Medical History: Past Medical History:  Diagnosis Date  . Hypertension      Past Surgical History: Past Surgical History:  Procedure Laterality Date  . gastric bypass       Family History: Family History  Problem Relation Age of Onset  . Hypertension Mother      Social History: Social History   Socioeconomic History  . Marital status: Married    Spouse name: Not on file  . Number of children: Not on file  . Years of education: Not on file  . Highest education level: Not on file  Occupational History  . Not on file  Social Needs  . Financial resource strain: Not hard at all  . Food insecurity    Worry: Never true    Inability: Never true  .  Transportation needs    Medical: No    Non-medical: No  Tobacco Use  . Smoking status: Never Smoker  . Smokeless tobacco: Never Used  Substance and Sexual Activity  . Alcohol use: Yes    Alcohol/week: 4.0 standard drinks    Types: 4 Cans of beer per week  . Drug use: No  . Sexual activity: Yes  Lifestyle  . Physical activity    Days per week: 0 days    Minutes per session: 0 min  . Stress: Not at all  Relationships  . Social Musicianconnections    Talks on phone: Once a week    Gets together: Once a week    Attends religious service: Never    Active member of club or organization: No    Attends meetings of clubs or organizations: Never    Relationship status: Married  . Intimate partner violence    Fear of current or ex partner: No    Emotionally abused: No    Physically abused: No    Forced sexual activity: No  Other Topics Concern  . Not on file  Social History Narrative   Lives with wife     Review of Systems: Review of Systems  Constitutional: Negative.  Negative for chills, diaphoresis, fever, malaise/fatigue and weight loss.  HENT: Negative.  Negative for congestion, ear discharge, ear pain, hearing loss, nosebleeds, sinus pain, sore throat and tinnitus.   Eyes: Negative.  Negative for blurred vision, double vision, photophobia, pain, discharge and redness.  Respiratory: Negative.  Negative for cough, hemoptysis, sputum production, shortness of breath, wheezing and stridor.   Cardiovascular: Negative.  Negative for chest pain, palpitations, orthopnea, claudication, leg swelling and PND.  Gastrointestinal: Negative.  Negative for abdominal pain, blood in stool, constipation, diarrhea, heartburn, melena, nausea and vomiting.  Genitourinary: Negative.  Negative for dysuria, flank pain, frequency, hematuria and urgency.  Musculoskeletal: Negative.  Negative for back pain, falls, joint pain, myalgias and neck pain.  Skin: Negative.  Negative for itching and rash.   Neurological: Positive for tremors and weakness. Negative for dizziness, tingling, sensory change, speech change, focal weakness, seizures, loss of consciousness and headaches.  Endo/Heme/Allergies: Negative for environmental allergies and polydipsia. Does not bruise/bleed easily.  Psychiatric/Behavioral: Positive for depression.  Vital Signs: Blood pressure 113/81, pulse 77, temperature 98.1 F (36.7 C), temperature source Oral, resp. rate 16, height  (1.626 m), weight 96 kg, SpO2 97 %.  Weight trends: Filed Weights   05/15/19 0201 05/15/19 0552  Weight: 84.4 kg 96 kg    Physical Exam: General: NAD, laying in bed  Head: Normocephalic, atraumatic. Moist oral mucosal membranes  Eyes: +icterus  Neck: +JVD  Lungs:  Clear to auscultation  Heart: Regular rate and rhythm  Abdomen:  +ascites  Extremities:  no peripheral edema.  Neurologic: +tremor  Skin: +jaundice         Lab results: Basic Metabolic Panel: Recent Labs  Lab 05/15/19 0557 05/16/19 0559 05/17/19 0330  NA 132* 134* 132*  K 3.4* 3.7 3.1*  CL 96* 99 101  CO2 21* 22 21*  GLUCOSE 78 172* 115*  BUN 64* 66* 67*  CREATININE 1.96* 1.95* 1.94*  CALCIUM 7.8* 7.8* 7.9*  MG 1.8  --   --   PHOS 4.4  --   --     Liver Function Tests: Recent Labs  Lab 05/15/19 0557 05/16/19 0918 05/17/19 0330  AST 120* 98* 74*  ALT 48* 38 34  ALKPHOS 128* 102 99  BILITOT 17.4* 18.0* 15.1*  PROT 6.8 6.5 6.0*  ALBUMIN 1.5* 1.8* 1.8*   Recent Labs  Lab 05/15/19 0211  LIPASE 38   Recent Labs  Lab 05/15/19 0211 05/16/19 0918  AMMONIA 66* 31    CBC: Recent Labs  Lab 05/15/19 0211 05/15/19 0557 05/16/19 0559 05/17/19 0330  WBC 14.2* 13.9* 8.6 16.1*  NEUTROABS 10.4*  --   --   --   HGB 13.3 13.2 10.1* 10.3*  HCT 37.6* 36.7* 28.3* 29.0*  MCV 99.2 96.3 96.9 97.6  PLT 177 160 136* 148*    Cardiac Enzymes: No results for input(s): CKTOTAL, CKMB, CKMBINDEX, TROPONINI in the last 168 hours.  BNP: Invalid  input(s): POCBNP  CBG: No results for input(s): GLUCAP in the last 168 hours.  Microbiology: Results for orders placed or performed during the hospital encounter of 05/15/19  SARS Coronavirus 2 [facility] order, Performed in [facility] hospital lab) Nasopharyngeal Nasopharyngeal Swab     Status: None   Collection Time: 05/15/19  2:11 AM   Specimen: Nasopharyngeal Swab  Result Value Ref Range Status   SARS Coronavirus 2 NEGATIVE NEGATIVE Final    Comment: (NOTE) If result is NEGATIVE SARS-CoV-2 target nucleic acids are NOT DETECTED. The SARS-CoV-2 RNA is generally detectable in upper and lower  respiratory specimens during the acute phase of infection. The lowest  concentration of SARS-CoV-2 viral copies this assay can detect is 250  copies / mL. A negative result does not preclude SARS-CoV-2 infection  and should not be used as the sole basis for treatment or other  patient management decisions.  A negative result may occur with  improper specimen collection / handling, submission of specimen other  than nasopharyngeal swab, presence of viral mutation(s) within the  areas targeted by this assay, and inadequate number of viral copies  (<250 copies / mL). A negative result must be combined with clinical  observations, patient history, and epidemiological information. If result is POSITIVE SARS-CoV-2 target nucleic acids are DETECTED. The SARS-CoV-2 RNA is generally detectable in upper and lower  respiratory specimens dur ing the acute phase of infection.  Positive  results are indicative of active infection with SARS-CoV-2.  Clinical  correlation with patient history and other diagnostic information is  necessary to  determine patient infection status.  Positive results do  not rule out bacterial infection or co-infection with other viruses. If result is PRESUMPTIVE POSTIVE SARS-CoV-2 nucleic acids MAY BE PRESENT.   A presumptive positive result was obtained on the submitted  specimen  and confirmed on repeat testing.  While 2019 novel coronavirus  (SARS-CoV-2) nucleic acids may be present in the submitted sample  additional confirmatory testing may be necessary for epidemiological  and / or clinical management purposes  to differentiate between  SARS-CoV-2 and other Sarbecovirus currently known to infect humans.  If clinically indicated additional testing with an alternate test  methodology [phone]) is advised. The SARS-CoV-2 RNA is generally  detectable in upper and lower respiratory sp ecimens during the acute  phase of infection. The expected result is Negative. Fact Sheet for Patients:  StrictlyIdeas.no Fact Sheet for Healthcare Providers: BankingDealers.co.za This test is not yet approved or cleared by the [place] FDA and has been authorized for detection and/or diagnosis of SARS-CoV-2 by FDA under an Emergency Use Authorization (EUA).  This EUA will remain in effect (meaning this test can be used) for the duration of the COVID-19 declaration under Section 564(b)(1) of the Act, 21 U.S.C. section 360bbb-3(b)(1), unless the authorization is terminated or revoked sooner. Performed at [facility], [address] [place], [place] [address]   Body fluid culture     Status: None (Preliminary result)   Collection Time: 05/15/19 10:06 AM   Specimen: PATH Cytology Peritoneal fluid  Result Value Ref Range Status   Specimen Description   Final    PERITONEAL Performed at [facility], [address], [place], [place] [address]    Special Requests   Final    NONE Performed at [facility], [address] [place], [place] [address]    Gram Stain   Final    WBC PRESENT,BOTH PMN AND MONONUCLEAR NO ORGANISMS SEEN CYTOSPIN SMEAR    Culture   Final    NO GROWTH 2 DAYS Performed at [facility], [address], [place], [place] [address]    Report Status PENDING   Incomplete  Culture, blood (routine x 2)     Status: None (Preliminary result)   Collection Time: 05/15/19  3:13 PM   Specimen: BLOOD  Result Value Ref Range Status   Specimen Description BLOOD LEFT ANTECUBITAL  Final   Special Requests   Final    BOTTLES DRAWN AEROBIC AND ANAEROBIC Blood Culture results may not be optimal due to an inadequate volume of blood received in culture bottles   Culture   Final    NO GROWTH 2 DAYS Performed at [facility], [address], [place], [place] [address]    Report Status PENDING  Incomplete  Culture, blood (routine x 2)     Status: None (Preliminary result)   Collection Time: 05/15/19  3:21 PM   Specimen: BLOOD  Result Value Ref Range Status   Specimen Description BLOOD BLOOD LEFT HAND  Final   Special Requests   Final    BOTTLES DRAWN AEROBIC AND ANAEROBIC Blood Culture adequate volume   Culture   Final    NO GROWTH 2 DAYS Performed at [facility], [address], [place], [place] [address]    Report Status PENDING  Incomplete    Coagulation Studies: Recent Labs    05/15/19 0211  LABPROT 20.9*  INR 1.8*    Urinalysis: Recent Labs    05/15/19 [place]*  LABSPEC 1.018  PHURINE TEST NOT REPORTED DUE TO COLOR INTERFERENCE OF URINE PIGMENT  GLUCOSEU TEST NOT REPORTED DUE TO COLOR INTERFERENCE OF URINE PIGMENT*  HGBUR TEST NOT REPORTED DUE TO COLOR INTERFERENCE OF URINE PIGMENT*  BILIRUBINUR TEST NOT REPORTED DUE TO COLOR INTERFERENCE OF URINE PIGMENT*  KETONESUR TEST NOT REPORTED DUE TO COLOR INTERFERENCE OF URINE PIGMENT*  PROTEINUR TEST NOT REPORTED DUE TO COLOR INTERFERENCE OF URINE PIGMENT*  NITRITE TEST NOT REPORTED DUE TO COLOR INTERFERENCE OF URINE PIGMENT*  LEUKOCYTESUR TEST NOT REPORTED DUE TO COLOR INTERFERENCE OF URINE PIGMENT*      Imaging:  No results found.   Assessment & Plan: Mr. Earvin Blazier is a 58 y.o. Hispanic male with alcoholic hepatic cirrhosis, gastric bypass,  atrial fibrillation, hypertension, tremor currently undergoing work up for Parkinson's , who was admitted to [facility] on 05/15/2019 for Hepatomegaly [R16.0] Abdominal distension [R14.0] Weakness [R53.1] Jaundice [R17] Calculus of gallbladder without cholecystitis without obstruction [K80.20] Alcoholic cirrhosis of liver with ascites (HCC) [K70.31] AKI (acute kidney injury) (HCC) [N17.9] Right upper quadrant abdominal pain [R10.11] Ascites due to alcoholic cirrhosis (HCC) [K70.31] Acute liver failure without hepatic coma [K72.00]   Underwent large volume paracentesis on admission with 2 liters removed.   1. Acute renal failure: baseline creatinine of 0.6 normal GFR on 01/2019.  2. Hyponatremia 3. Hypokalemia 4. Metabolic acidosis 5. Fulminant Hepatic failure 6. 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  - Prednisolone - IV albumin  - ceftriaxone for SBP prophylaxis.   Overall prognosis is poor. Discussed renal replacement and hemodialysis with patient and wife.  Appreciate GI and palliative care input.   LOS: 2 Tyanne Derocher 10/1/202010:42 AM

## 2019-05-17 NOTE — Consult Note (Signed)
Consultation Note Date: 05/17/2019   Patient Name: Melvin Stone  DOB: [birth year]  MRN: [id number]  Age / Sex: 58 y.o., male   PCP: Kirk Ruths, MD Referring Physician: Max Sane, MD   REASON FOR CONSULTATION:Establishing goals of care  Palliative Care consult requested for this 58 y.o. male with multiple medical problems including alcohol abuse, tremors, ataxia, paroxysmal atrial fibrillation, alcoholic hepatitis, hypertension, OSA, diabetes mellitus, and obesity status post gastric bypass. He presented to ED with complaints of abdominal pain x 3 weeks. He reported dark orange colored urine, noticeable skin color changes, shortness of breath, fatigue, decreased appetite, and diarrhea. Wife also reported on admission patient had been sleeping more and forgetful over the last month. During his ED work-up labs revealed WBC of 14.2, sodium 132, potassium 3.4, BUN 61, creatinine 1.88, AST 119, ALT 46, alk phos 134, bilirubin 17.7, ammonia level 66, albumin 1.6, COVID-19 negative.  Ultrasound abdomen right upper quadrant showed mild gallbladder distention and hepatic steatosis.  Sinusitis showed moderate volume of abdominal ascites present in all 4 quadrant. Since admission he has been followed by Nephorology due to AKI-hepatorenal and GI. He is s/p paracentesis (9/29) 2L.  Palliative Medicine team consulted for goals of care.   Clinical Assessment and Goals of Care: I have reviewed medical records including lab results, imaging, Epic notes, and MAR, received report from the bedside RN, and assessed the patient. I met at the bedside with patient to discuss diagnosis prognosis, GOC, EOL wishes, disposition and options. Patient is awake, alert and oriented x4. He complains of some mild generalized pain which he states is more from lying in bed over the past few days. No family at the bedside. I offered to involve his wife in discussion however, he declined.   I introduced Palliative  Medicine as specialized medical care for people living with serious illness. It focuses on providing relief from the symptoms and stress of a serious illness. The goal is to improve quality of life for both the patient and the family.  We discussed a brief life review of the patient, along with his functional and nutritional status. Melvin Stone shares that he and his wife have been married for 30 years. He hs 3 children 2 are from his first marriage. 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 He is originally from [place].   [name] reports that prior to 6 weeks ago he felt that he was in normal health. He felt that his health started declining about 4-5 weeks ago with a more drastic decline within the past 2-3 weeks. He states he began losing weight, had no appetite, easily fatigued with shortness of breath. He could barely bath himself. He also noticed yellowing of his eyes, dark orange urine, and uncontrolled diarrhea.  He states he just began feeling like "shit!" He does admit to drinking alcohol which he states he has not drank in over 3 weeks because he felt maybe he was drinking to much and that was making him feel so bad. He states he would drink both beer, mixed drinks, and liquor.   We discussed His current illness and what it means in the larger context of His on-going co-morbidities. With specific discussions regarding his severe alcoholic liver cirrhosis, AKI, and his overall functional and nutritional decline. Natural disease trajectory and expectations at EOL were discussed.  Melvin Stone became extremely tearful expressing "this is all so new to me. I  was just fine. I want to be fine!! I know I am not though. I am hopeful that I can transfer to [facility] and be seen and get even more hope. How can they not take me, I am a [employer] and served this country!" Therapeutic listening and support provided. He states he is eating a little better and feels a  little better. He shares he cannot eat as much due to gastric bypass surgery so that also limits what and how much he eats.   I allowed [name] to express his frustrations of his unawareness that his health was so bad and his poor health and lifestyle choices.   I attempted to elicit values and goals of care important to the patient.    The difference between aggressive medical intervention and comfort care was considered in light of the patient's goals of care.  Patient expresses he is remaining hopeful that he can improve with his expressed awareness that cirrhosis does not "just" go away "ever". He states he wants all aggressive medical interventions no matter what it is to at least give him the opportunity to have a chance. He states "if things don't work, we will then have to see what happens, but I have to first know they aren't working!"   Advanced directives, concepts specific to code status, artifical feeding and hydration, dialysis, and rehospitalization were considered and discussed. Patient reports he does have a documented advanced directive and his wife, Mykai Wendorf is his HCPOA. I discussed his full code status with consideration to his current illness and condition. He is tearful in discussion and states "I am a [employer]. I want everything done for me right now, even if I am on machines.! He states if I get to [facility] and nothing still cannot be done, my wife and I will then make decisions or her if I can't.   Patient aware continued support will be provided while hospitalized. He verbalized understanding and appreciation. I also offered Spiritual support however, patient declined at this time.    Questions and concerns were addressed. The family was encouraged to call with questions or concerns.  PMT will continue to support holistically.   SOCIAL HISTORY:     reports that he has never smoked. He has never used smokeless tobacco. He reports current alcohol use of about 4.0 standard  drinks of alcohol per week. He reports that he does not use drugs.  CODE STATUS: Full code  ADVANCE DIRECTIVES: Wife SYMPTOM MANAGEMENT: per attending   Palliative Prophylaxis:   Bowel Regimen and Frequent Pain Assessment  PSYCHO-SOCIAL/SPIRITUAL:  Support System: Family   Desire for further Chaplaincy support: NO   Additional Recommendations (Limitations, Scope, Preferences):  Full Scope Treatment   PAST MEDICAL HISTORY: Past Medical History:  Diagnosis Date  . Hypertension     PAST SURGICAL HISTORY:  Past Surgical History:  Procedure Laterality Date  . gastric bypass      ALLERGIES:  is allergic to other.   MEDICATIONS:  Current Facility-Administered Medications  Medication Dose Route Frequency Provider Last Rate Last Dose  . albumin human 5 % solution 25 g  25 g Intravenous BID Lin Landsman, MD      . cefTRIAXone (ROCEPHIN) 1 g in sodium chloride 0.9 % 100 mL IVPB  1 g Intravenous Q24H Lang Snow, NP 200 mL/hr at 05/16/19 2204 1 g at 05/16/19 2204  . feeding supplement (BOOST / RESOURCE BREEZE) liquid 1 Container  1 Container Oral TID BM Vanga, Rohini  Reece Levy, MD   1 Container at 05/16/19 2208  . fluticasone (FLONASE) 50 MCG/ACT nasal spray 2 spray  2 spray Each Nare Daily PRN Lang Snow, NP      . folic acid (FOLVITE) tablet 1 mg  1 mg Oral Daily Lang Snow, NP   1 mg at 05/17/19 1111  . loratadine (CLARITIN) tablet 10 mg  10 mg Oral Daily Hart Robinsons A, RPH   10 mg at 05/17/19 1111  . LORazepam (ATIVAN) tablet 1-4 mg  1-4 mg Oral Q1H PRN Lang Snow, NP   1 mg at 05/15/19 1104   Or  . LORazepam (ATIVAN) injection 1-4 mg  1-4 mg Intravenous Q1H PRN Lang Snow, NP      . midodrine (PROAMATINE) tablet 10 mg  10 mg Oral TID WC Vanga, Tally Due, MD      . multivitamin with minerals tablet 1 tablet  1 tablet Oral Daily Lang Snow, NP   1 tablet at 05/17/19 1111  . octreotide  (SANDOSTATIN) injection 200 mcg  200 mcg Subcutaneous TID Lin Landsman, MD      . pneumococcal 23 valent vaccine (PNU-IMMUNE) injection 0.5 mL  0.5 mL Intramuscular Tomorrow-1000 Ouma, Bing Neighbors, NP      . prednisoLONE (ORAPRED) 15 MG/5ML solution 40 mg  40 mg Oral QAC breakfast Lin Landsman, MD   40 mg at 05/17/19 0630  . rifaximin (XIFAXAN) tablet 550 mg  550 mg Oral BID Lin Landsman, MD   550 mg at 05/17/19 1111  . thiamine (VITAMIN B-1) tablet 100 mg  100 mg Oral Daily Lang Snow, NP   100 mg at 05/17/19 1111    VITAL SIGNS: BP 113/81 (BP Location: Left Arm)   Pulse 77   Temp 98.1 F (36.7 C) (Oral)   Resp 16   Ht _0  (1.626 m)   Wt 96 kg   SpO2 97%   BMI 36.34 kg/m  Filed Weights   05/15/19 0201 05/15/19 0552  Weight: 84.4 kg 96 kg    Estimated body mass index is 36.34 kg/m as calculated from the following:   Height as of this encounter: _1  (1.626 m).   Weight as of this encounter: 96 kg.  LABS: CBC:    Component Value Date/Time   WBC 16.1 (H) 05/17/2019 0330   HGB 10.3 (L) 05/17/2019 0330   HCT 29.0 (L) 05/17/2019 0330   HCT 43.4 01/30/2019 2041   PLT 148 (L) 05/17/2019 0330   Comprehensive Metabolic Panel:    Component Value Date/Time   NA 132 (L) 05/17/2019 0330   K 3.1 (L) 05/17/2019 0330   CO2 21 (L) 05/17/2019 0330   BUN 67 (H) 05/17/2019 0330   CREATININE 1.94 (H) 05/17/2019 0330   ALBUMIN 1.8 (L) 05/17/2019 0330     Review of Systems  Constitutional: Positive for fatigue.  Cardiovascular: Positive for leg swelling.  Gastrointestinal: Positive for abdominal distention, abdominal pain and diarrhea.  Neurological: Positive for weakness.  Unless otherwise noted, a complete review of systems is negative.  Physical Exam General: NAD, frail appearing, thin Cardiovascular: regular rate and rhythm Pulmonary: diminished bases Abdomen: soft, nontender, + bowel sounds, mild distension Extremities: bilateral  trace edema, no joint deformities Skin: no rashes, jaundice, icterus Neurological: Weakness, tearful, A&O x4   Prognosis: Guarded to Poor   Discharge Planning:  To Be Determined  Recommendations:  Full Code-at patient's request  Continue with current plan of care per  medical team  Patient remains hopeful he can transfer to [facility] for continued care.   Requesting full aggressive, full scope care  PMT will continue to support and follow as needed    Palliative Performance Scale: 40%              Patient expressed understanding and was in agreement with this plan.   Thank you for allowing the Palliative Medicine Team to assist in the care of this patient.  Time Total: 55 min.   Visit consisted of counseling and education dealing with the complex and emotionally intense issues of symptom management and palliative care in the setting of serious and potentially life-threatening illness.Greater than 50%  of this time was spent counseling and coordinating care related to the above assessment and plan.  Signed by:  Alda Lea, AGPCNP-BC Palliative Medicine Team  Phone: [phone] Fax: [phone] Pager: [phone] Amion: Bjorn Pippin

## 2019-05-17 NOTE — Progress Notes (Signed)
Called Dr. Rayvon Char, transplant hepatologist at [facility] and discussed patient's clinical condition in detail over phone and she accepted the transfer She also recommended midodrine 10 mg 3 times daily, octreotide 200 mg subcut 3 times daily along with IV albumin due to worsening kidney function, goal to maintain MAPs>70 Will discontinue metoprolol and monitor him closely  Full progress note to follow  [name], MD [address]  [place]  [place], [place] [address]  Main: [phone]  Fax: [phone] Pager: [phone]

## 2019-05-18 ENCOUNTER — Inpatient Hospital Stay: Payer: BC Managed Care – PPO

## 2019-05-18 LAB — COMPREHENSIVE METABOLIC PANEL
ALT: 36 U/L (ref 0–44)
AST: 63 U/L — ABNORMAL HIGH (ref 15–41)
Albumin: 1.7 g/dL — ABNORMAL LOW (ref 3.5–5.0)
Alkaline Phosphatase: 104 U/L (ref 38–126)
Anion gap: 10 (ref 5–15)
BUN: 71 mg/dL — ABNORMAL HIGH (ref 6–20)
CO2: 20 mmol/L — ABNORMAL LOW (ref 22–32)
Calcium: 7.7 mg/dL — ABNORMAL LOW (ref 8.9–10.3)
Chloride: 103 mmol/L (ref 98–111)
Creatinine, Ser: 1.81 mg/dL — ABNORMAL HIGH (ref 0.61–1.24)
GFR calc Af Amer: 47 mL/min — ABNORMAL LOW (ref >60)
GFR calc non Af Amer: 40 mL/min — ABNORMAL LOW (ref >60)
Glucose, Bld: 121 mg/dL — ABNORMAL HIGH (ref 70–99)
Potassium: 3.1 mmol/L — ABNORMAL LOW (ref 3.5–5.1)
Sodium: 133 mmol/L — ABNORMAL LOW (ref 135–145)
Total Bilirubin: 13.2 mg/dL — ABNORMAL HIGH (ref 0.3–1.2)
Total Protein: 5.6 g/dL — ABNORMAL LOW (ref 6.5–8.1)

## 2019-05-18 LAB — IRON AND TIBC: Iron: 69 ug/dL (ref 45–182)

## 2019-05-18 LAB — BODY FLUID CULTURE: Culture: NO GROWTH

## 2019-05-18 LAB — CBC
HCT: 29.2 % — ABNORMAL LOW (ref 39.0–52.0)
Hemoglobin: 10.3 g/dL — ABNORMAL LOW (ref 13.0–17.0)
MCH: 34.8 pg — ABNORMAL HIGH (ref 26.0–34.0)
MCHC: 35.3 g/dL (ref 30.0–36.0)
MCV: 98.6 fL (ref 80.0–100.0)
Platelets: 153 10*3/uL (ref 150–400)
RBC: 2.96 MIL/uL — ABNORMAL LOW (ref 4.22–5.81)
RDW: 14 % (ref 11.5–15.5)
WBC: 14 10*3/uL — ABNORMAL HIGH (ref 4.0–10.5)
nRBC: 0 % (ref 0.0–0.2)

## 2019-05-18 LAB — FERRITIN: Ferritin: 3124 ng/mL — ABNORMAL HIGH (ref 24–336)

## 2019-05-18 LAB — FOLATE: Folate: 24 ng/mL (ref >5.9)

## 2019-05-18 LAB — VITAMIN D 25 HYDROXY (VIT D DEFICIENCY, FRACTURES): Vit D, 25-Hydroxy: 58.39 ng/mL (ref 30–100)

## 2019-05-18 LAB — AMMONIA: Ammonia: 72 umol/L — ABNORMAL HIGH (ref 9–35)

## 2019-05-18 MED ORDER — PREDNISOLONE SODIUM PHOSPHATE 15 MG/5ML PO SOLN: 40.0000 mg | Freq: Every day | ORAL | 0 refills | Status: AC

## 2019-05-18 MED ORDER — POTASSIUM CHLORIDE CRYS ER 20 MEQ PO TBCR: 40.0000 meq | EXTENDED_RELEASE_TABLET | Freq: Once | ORAL | 0 refills | Status: AC

## 2019-05-18 MED ORDER — ALBUMIN HUMAN 5 % IV SOLN: 25.0000 g | Freq: Two times a day (BID) | INTRAVENOUS | 0 refills | Status: AC

## 2019-05-18 MED ORDER — ENSURE MAX PROTEIN PO LIQD
11.0000 [oz_av] | Freq: Three times a day (TID) | ORAL | Status: DC
  Administered 2019-05-18: 18:00:00 11 [oz_av] via ORAL
  Filled 2019-05-18: qty 330

## 2019-05-18 MED ORDER — OCTREOTIDE ACETATE 100 MCG/ML IJ SOLN: 200.0000 ug | Freq: Three times a day (TID) | INTRAMUSCULAR | Status: AC

## 2019-05-18 MED ORDER — BUTALBITAL-APAP-CAFFEINE 50-325-40 MG PO TABS
1.0000 | ORAL_TABLET | Freq: Four times a day (QID) | ORAL | Status: DC | PRN
  Administered 2019-05-18 (×2): 1 via ORAL
  Filled 2019-05-18 (×3): qty 1

## 2019-05-18 MED ORDER — LACTULOSE 10 GM/15ML PO SOLN: 30.0000 g | Freq: Three times a day (TID) | ORAL | Status: DC

## 2019-05-18 MED ORDER — LACTULOSE 10 GM/15ML PO SOLN
10.0000 g | Freq: Two times a day (BID) | ORAL | Status: DC
  Administered 2019-05-18: 10 g via ORAL
  Filled 2019-05-18: qty 30

## 2019-05-18 MED ORDER — RIFAXIMIN 550 MG PO TABS: 550.0000 mg | ORAL_TABLET | Freq: Two times a day (BID) | ORAL | Status: AC

## 2019-05-18 MED ORDER — MIDODRINE HCL 10 MG PO TABS: 10.0000 mg | ORAL_TABLET | Freq: Three times a day (TID) | ORAL | Status: AC

## 2019-05-18 MED ORDER — POTASSIUM CHLORIDE CRYS ER 20 MEQ PO TBCR
40.0000 meq | EXTENDED_RELEASE_TABLET | Freq: Once | ORAL | Status: AC
  Administered 2019-05-18: 17:00:00 40 meq via ORAL
  Filled 2019-05-18: qty 2

## 2019-05-18 MED ORDER — LACTULOSE 10 GM/15ML PO SOLN: 10.0000 g | Freq: Two times a day (BID) | ORAL | 0 refills | Status: AC

## 2019-05-18 NOTE — Progress Notes (Signed)
[facility] unavailable to provide transport. [facility] called and arranged. Madlyn Frankel, RN

## 2019-05-18 NOTE — Progress Notes (Signed)
PT Cancellation Note  Patient Details Name: Melvin Stone MRN: [id number] DOB: [birth year]   Cancelled Treatment:    Reason Eval/Treat Not Completed: Patient declined, no reason specified Chart reviewed, case discussed with nursing prior to entry.  Pt states he walked around the nurses' station earlier today - family in room, son confirms that he walked with him around the nurses' station using a walker (baseline does not use AD).  Pt has been accepted at [facility], waiting for bed.  He requests not to have PT come by any more while here at [facility].  Subjectively he and family feel that from a PT perspective he is safe to go home, will complete PT orders.  Should a significant change in plan or status arrive will need new PT orders.   Kreg Shropshire, DPT 05/18/2019, 2:52 PM

## 2019-05-18 NOTE — Progress Notes (Addendum)
Initial Nutrition Assessment  DOCUMENTATION CODES:   Obesity unspecified  INTERVENTION:   Ensure Max protein supplement TID, each supplement provides 150kcal and 30g of protein  MVI and folic acid daily in setting of etoh abuse  Recommend 500 mg of thiamine intravenously, infused over 30 minutes, three times daily for two consecutive days and 250 mg intravenously once daily for an additional five days  Pt at high risk for nutrient deficiencies r/t his gastric bypass surgery. Will check thiamine, ascorbic acid, folate, iron/TIBC/ferritin, calcium, zinc, copper, and vitamins D, A, E, & K labs.   Pt likely at refeed risk; recommend monitor K, Mg and P labs daily.   NUTRITION DIAGNOSIS:   Increased nutrient needs related to chronic illness(cirrhosis) as evidenced by increased estimated needs.  GOAL:   Patient will meet greater than or equal to 90% of their needs  MONITOR:   PO intake, Supplement acceptance, Labs, Weight trends, Skin, I & O's  REASON FOR ASSESSMENT:   Consult Assessment of nutrition requirement/status  ASSESSMENT:   58 y.o. male with pertinent past medical history of EtOH abuse, tremors, ataxia, paroxysmal atrial fibrillation, alcoholic hepatitis and cirrhosis, hypertension, OSA, diabetes mellitus, obesity status post roux-en-y gastric bypass 2014 presenting to the ED with chief complaints of abdominal tightness, shortness of breath and generalized fatigue.  Pt working with PT at time of RD visit today. Pt's nutritional history provided by wife at bedside. Wife reports pt with fairly good appetite and oral intake at baseline; pt eats frequent small meals r/t his h/o roux-en-y surgery. Pt has had decreased appetite and oral intake for the past month. Pt does not drink any nutritional supplements at home but wife reports that he is fairly compliant with taking a daily MVI but had slacked off over the past month. Pt ate about 50% of an omelet this morning and drank a  [employer]. Pt was >400lbs prior to his gastric bypass surgery and lost 220lbs total after his surgery. Pt has regained some of his weight back. RD will add supplements to help pt meet his estimated needs. Would recommend high dose IV thiamine in setting of etoh abuse and h/o gastric bypass. Pt is at high risk for nutrient deficiencies r/t his gastric surgery. RD will check vitamin labs as recommended by the [facility]. Pt is currenly awaiting transfer to [facility].    Medications reviewed and include: folic acid, lactulose, MVI, octreotide, thiamine, albumin, ceftriaxone  Labs reviewed: Na 133(L), K 3.1(L), BUN 71(H), creat 1.81(H), Ca 7.7(L) adj 9.54 wnl, alb 1.7(L), AST 63(H), ammonis 72(H), tbili 13.2(H) Wbc- 14.0(H), Hgb 10.3(L), Hct 29.2(L)  Unable to complete Nutrition-Focused physical exam at this time.   Diet Order:   Diet Order            Diet regular Room service appropriate? Yes; Fluid consistency: Thin  Diet effective now             EDUCATION NEEDS:   Education needs have been addressed  Skin:  Skin Assessment: Reviewed RN Assessment(ecchymosis)  Last BM:  10/1- type 5  Height:   Ht Readings from Last 1 Encounters:  05/15/19 5\' 4"  (1.626 m)    Weight:   Wt Readings from Last 1 Encounters:  05/15/19 96 kg    Ideal Body Weight:  59 kg  BMI:  Body mass index is 36.34 kg/m.  Estimated Nutritional Needs:   Kcal:  2100-2400kcal/day  Protein:  105-120g/day  Fluid:  >1.8L/day  Koleen Distance MS,  RD, LDN Pager #- [phone] Office#- [phone] After Hours Pager: [phone]

## 2019-05-18 NOTE — Progress Notes (Signed)
[facility] unsure of ability to transfer tonight. Contacted [facility] transfer center. Awaiting call back for availability of transportation. Madlyn Frankel, RN

## 2019-05-18 NOTE — Progress Notes (Signed)
Arlyss Repressohini R , MD [address]  [address]  [place], [place] [id number]  Main: [phone]  Fax: [phone] Pager: [phone]   Subjective: No acute events overnight, reports dry cough. Had 1 brown BM this morning Wife and son bedside   Objective: Vital signs in last 24 hours: Vitals:   05/16/19 1943 05/17/19 0347 05/17/19 2033 05/18/19 0526  BP: 120/87 113/81 113/81 95/70  Pulse: 83 77 72 71  Resp: 20 16 16 16   Temp: 97.9 F (36.6 C) 98.1 F (36.7 C) 98.5 F (36.9 C) 98.5 F (36.9 C)  TempSrc:  Oral Oral Oral  SpO2: 97% 97% 98% 98%  Weight:      Height:       Weight change:   Intake/Output Summary (Last 24 hours) at 05/18/2019 1259 Last data filed at 05/18/2019 1025 Gross per 24 hour  Intake 772.99 ml  Output 225 ml  Net 547.99 ml     Exam: Heart:: Regular rate and rhythm, S1S2 present or without murmur or extra heart sounds Lungs: normal and clear to auscultation Abdomen: Soft, nontender, mildly diffusely distended, redundant skin from loss of fat from gastric bypass   Lab Results: CBC Latest Ref Rng & Units 05/18/2019 05/17/2019 05/16/2019  WBC 4.0 - 10.5 K/uL 14.0(H) 16.1(H) 8.6  Hemoglobin 13.0 - 17.0 g/dL 10.3(L) 10.3(L) 10.1(L)  Hematocrit 39.0 - 52.0 % 29.2(L) 29.0(L) 28.3(L)  Platelets 150 - 400 K/uL 153 148(L) 136(L)   CMP Latest Ref Rng & Units 05/18/2019 05/17/2019 05/16/2019  Glucose 70 - 99 mg/dL 324(M121(H) 010(U115(H) 725(D172(H)  BUN 6 - 20 mg/dL 66(Y71(H) 40(H67(H) 47(Q66(H)  Creatinine 0.61 - 1.24 mg/dL 2.59(D1.81(H) 6.38(V1.94(H) 5.64(P1.95(H)  Sodium 135 - 145 mmol/L 133(L) 132(L) 134(L)  Potassium 3.5 - 5.1 mmol/L 3.1(L) 3.1(L) 3.7  Chloride 98 - 111 mmol/L 103 101 99  CO2 22 - 32 mmol/L 20(L) 21(L) 22  Calcium 8.9 - 10.3 mg/dL 7.7(L) 7.9(L) 7.8(L)  Total Protein 6.5 - 8.1 g/dL 3.2(R5.6(L) 6.0(L) 6.5  Total Bilirubin 0.3 - 1.2 mg/dL 13.2(H) 15.1(H) 18.0(H)  Alkaline Phos 38 - 126 U/L 104 99 102  AST 15 - 41 U/L 63(H) 74(H) 98(H)  ALT 0 - 44 U/L 36 34 38    Micro  Results: Recent Results (from the past 240 hour(s))  SARS Coronavirus 2 [facility](Hospital order, Performed in [facility] hospital lab) Nasopharyngeal Nasopharyngeal Swab     Status: None   Collection Time: 05/15/19  2:11 AM   Specimen: Nasopharyngeal Swab  Result Value Ref Range Status   SARS Coronavirus 2 NEGATIVE NEGATIVE Final    Comment: (NOTE) If result is NEGATIVE SARS-CoV-2 target nucleic acids are NOT DETECTED. The SARS-CoV-2 RNA is generally detectable in upper and lower  respiratory specimens during the acute phase of infection. The lowest  concentration of SARS-CoV-2 viral copies this assay can detect is 250  copies / mL. A negative result does not preclude SARS-CoV-2 infection  and should not be used as the sole basis for treatment or other  patient management decisions.  A negative result may occur with  improper specimen collection / handling, submission of specimen other  than nasopharyngeal swab, presence of viral mutation(s) within the  areas targeted by this assay, and inadequate number of viral copies  (<250 copies / mL). A negative result must be combined with clinical  observations, patient history, and epidemiological information. If result is POSITIVE SARS-CoV-2 target nucleic acids are DETECTED. The SARS-CoV-2 RNA is generally detectable in upper  and lower  respiratory specimens dur ing the acute phase of infection.  Positive  results are indicative of active infection with SARS-CoV-2.  Clinical  correlation with patient history and other diagnostic information is  necessary to determine patient infection status.  Positive results do  not rule out bacterial infection or co-infection with other viruses. If result is PRESUMPTIVE POSTIVE SARS-CoV-2 nucleic acids MAY BE PRESENT.   A presumptive positive result was obtained on the submitted specimen  and confirmed on repeat testing.  While 2019 novel coronavirus  (SARS-CoV-2) nucleic acids may be present in the submitted  sample  additional confirmatory testing may be necessary for epidemiological  and / or clinical management purposes  to differentiate between  SARS-CoV-2 and other Sarbecovirus currently known to infect humans.  If clinically indicated additional testing with an alternate test  methodology [phone]) is advised. The SARS-CoV-2 RNA is generally  detectable in upper and lower respiratory sp ecimens during the acute  phase of infection. The expected result is Negative. Fact Sheet for Patients:  BoilerBrush.com.cy Fact Sheet for Healthcare Providers: https://pope.com/ This test is not yet approved or cleared by the [place] FDA and has been authorized for detection and/or diagnosis of SARS-CoV-2 by FDA under an Emergency Use Authorization (EUA).  This EUA will remain in effect (meaning this test can be used) for the duration of the COVID-19 declaration under Section 564(b)(1) of the Act, 21 U.S.C. section 360bbb-3(b)(1), unless the authorization is terminated or revoked sooner. Performed at [facility], [address] [place], [place] [address]   Body fluid culture     Status: None   Collection Time: 05/15/19 10:06 AM   Specimen: PATH Cytology Peritoneal fluid  Result Value Ref Range Status   Specimen Description   Final    PERITONEAL Performed at [facility], [address], [place], [place] [address]    Special Requests   Final    NONE Performed at [facility], [address] [place], [place] [address]    Gram Stain   Final    WBC PRESENT,BOTH PMN AND MONONUCLEAR NO ORGANISMS SEEN CYTOSPIN SMEAR    Culture   Final    NO GROWTH 3 DAYS Performed at [facility], [address] [place], [place] [address]    Report Status 05/18/2019 FINAL  Final  Culture, blood (routine x 2)     Status: None (Preliminary result)   Collection Time: 05/15/19  3:13 PM   Specimen: BLOOD  Result Value Ref  Range Status   Specimen Description BLOOD LEFT ANTECUBITAL  Final   Special Requests   Final    BOTTLES DRAWN AEROBIC AND ANAEROBIC Blood Culture results may not be optimal due to an inadequate volume of blood received in culture bottles   Culture   Final    NO GROWTH 3 DAYS Performed at [facility], [address], [place], [place] [address]    Report Status PENDING  Incomplete  Culture, blood (routine x 2)     Status: None (Preliminary result)   Collection Time: 05/15/19  3:21 PM   Specimen: BLOOD  Result Value Ref Range Status   Specimen Description BLOOD BLOOD LEFT HAND  Final   Special Requests   Final    BOTTLES DRAWN AEROBIC AND ANAEROBIC Blood Culture adequate volume   Culture   Final    NO GROWTH 3 DAYS Performed at [facility], [address], [place], [place] [address]    Report  Status PENDING  Incomplete   Studies/Results: No results found. Medications:  I have reviewed the patient's current medications. Prior to Admission:  Medications Prior to Admission  Medication Sig Dispense Refill Last Dose  . fluticasone (FLONASE) 50 MCG/ACT nasal spray Place 2 sprays into both nostrils daily as needed.   prn at prn  . levocetirizine (XYZAL) 5 MG tablet Take 5 mg by mouth daily.   05/14/2019 at Unknown time  . metoprolol tartrate (LOPRESSOR) 25 MG tablet Take 1 tablet by mouth 2 (two) times daily.   05/14/2019 at Unknown time  . Multiple Vitamin (MULTIVITAMIN WITH MINERALS) TABS tablet Take 1 tablet by mouth daily.   05/14/2019 at Unknown time  . ondansetron (ZOFRAN) 8 MG tablet Take 1 tablet by mouth every 8 (eight) hours as needed.   prn at prn   Scheduled: . feeding supplement  1 Container Oral TID BM  . folic acid  1 mg Oral Daily  . lactulose  10 g Oral BID  . loratadine  10 mg Oral Daily  . midodrine  10 mg Oral TID WC  . multivitamin with minerals  1 tablet Oral Daily  . octreotide  200 mcg Subcutaneous TID  . pneumococcal 23 valent vaccine  0.5  mL Intramuscular Tomorrow-1000  . prednisoLONE  40 mg Oral QAC breakfast  . rifaximin  550 mg Oral BID  . thiamine  100 mg Oral Daily   Continuous: . albumin human 25 g (05/18/19 0944)  . cefTRIAXone (ROCEPHIN)  IV 1 g (05/17/19 2029)   NGE:[id number], fluticasone Anti-infectives (From admission, onward)   Start     Dose/Rate Route Frequency Ordered Stop   05/16/19 2200  rifaximin (XIFAXAN) tablet 550 mg     550 mg Oral 2 times daily 05/16/19 1627     05/15/19 0530  cefTRIAXone (ROCEPHIN) 1 g in sodium chloride 0.9 % 100 mL IVPB     1 g 200 mL/hr over 30 Minutes Intravenous Every 24 hours 05/15/19 0505       Scheduled Meds: . feeding supplement  1 Container Oral TID BM  . folic acid  1 mg Oral Daily  . lactulose  10 g Oral BID  . loratadine  10 mg Oral Daily  . midodrine  10 mg Oral TID WC  . multivitamin with minerals  1 tablet Oral Daily  . octreotide  200 mcg Subcutaneous TID  . pneumococcal 23 valent vaccine  0.5 mL Intramuscular Tomorrow-1000  . prednisoLONE  40 mg Oral QAC breakfast  . rifaximin  550 mg Oral BID  . thiamine  100 mg Oral Daily   Continuous Infusions: . albumin human 25 g (05/18/19 0944)  . cefTRIAXone (ROCEPHIN)  IV 1 g (05/17/19 2029)   PRN Meds:.butalbital-acetaminophen-caffeine, fluticasone   Assessment: Active Problems:   Ascites due to alcoholic cirrhosis (HCC)  Acute on chronic alcoholic liver failure SAAG > 1.1 consistent with portal hypertension  Plan: Acute on chronic alcoholic liver failure: MELD-Na 31 LFTs improving, T Bili 18-->15--> 13 Acute viral hepatitis panel negative for hepatitis A, B and C Blood cultures no growth to date Continue ceftriaxone for SBP prophylaxis Continue prednisone 40 mg daily, day 3.  Calculate Lillie score on day 7 of steroids in deciding whether to continue treatment. A score >0.45 suggests that a patient is not responding to glucocorticoid therapy.  No signs of active GI bleed  Monitor daily LFTs Check PT/INR tomorrow Continue rifaximin for hepatic encephalopathy Restart lactulose 15ml BID, hold for BMs > 2-3  per day to prevent dehydration Continue multivitamin plus thiamine plus folate Encourage p.o. nutrition and protein supplements 3 times a day Dietician consult Discussed with Dr. Drue Novel, transplant hepatologist at [facility] for transfer and patient is accepted.  Currently on wait list for transfer If patient is being discharged on Sunday based on his response to steroids, he needs to follow up with me in 1week after discharge  Acute kidney injury/HRS: Cr slightly better today, still quite uremic Continue 25g IV albumin 2 times daily Continue midodrine 10 mg 3 times daily Octreotide 261mcg subcutaneous 3 times daily Discontinued metoprolol Encourage p.o. hydration Strict I's and O's Nephrology is following  Cough Recommend CXR to evaluate for pleural effusions/pulmonary vascular congestion Incentive spirometry OOB to chair PT  Overall prognosis is still guarded, discussed in detail with both patient and his wife GI will continue to follow along with you until transferred to [facility] Dr Alice Reichert will cover for the weekend    LOS: 3 days     05/18/2019, 12:59 PM

## 2019-05-18 NOTE — Discharge Summary (Signed)
[place] [facility] [place] NAME: Melvin Stone    MR#:  [id number]  DATE OF BIRTH:  May 08, 1961  DATE OF ADMISSION:  05/15/2019   ADMITTING PHYSICIAN: Lang Snow, NP  DATE OF DISCHARGE: 05/18/2019  PRIMARY CARE PHYSICIAN: Kirk Ruths, MD   ADMISSION DIAGNOSIS:  Hepatomegaly [R16.0] Abdominal distension [R14.0] Weakness [R53.1] Jaundice [R17] Calculus of gallbladder without cholecystitis without obstruction [B28.41] Alcoholic cirrhosis of liver with ascites ([place]) [K70.31] AKI (acute kidney injury) ([place]) [N17.9] Right upper quadrant abdominal pain [R10.11] Ascites due to alcoholic cirrhosis ([place]) [L24.40] Acute liver failure without hepatic coma [K72.00] DISCHARGE DIAGNOSIS:  Active Problems:   Ascites due to alcoholic cirrhosis ([place])  SECONDARY DIAGNOSIS:   Past Medical History:  Diagnosis Date  . Hypertension    HOSPITAL COURSE:  58 y.o.malewith pertinent past medical history of EtOH abuse, tremors, ataxia, paroxysmal atrial fibrillation, alcoholic hepatitis, hypertension, OSA, diabetes mellitus, obesity status post gastric bypassadmitted forabdominal tightness, shortness of breath and generalized fatigue  1.Acute severe alcoholic liver disease-patient presenting with jaundice, icterus, elevated LFTs and bilirubin levels, coagulopathy with elevated INR  -Ammonia level is trending up now, on rifaximin twice a day, added lactulose 3 times a day and recheck ammonia tomorrow - His LFTs are consistent with severe alcoholic hepatitis -According to GI his30-day mortality is>50%based on the score -Continue prednisone 40 mg once daily - day 3 -Continue midodrine 10 mg 3 times daily, octreotide 200 mg subcut3 times daily along with IV albumin due to worsening kidney function, goal to maintain MAPs>70 - Hepatitis panel is negative  2. Ascites due to alcoholic cirrhosis -Status postIR paracentesiswith removal  of 2 L of fluid on 9/29 - SBP Prophylaxis with Ceftriaxone 1g IV daily   3. AKI - Hepatorenal syndrome  4. Hyperammonemia -secondary to above, no evidence of hepatic encephalopathy - Lactulose restarted,last ammonia of 66->31-> 47-> 72 -Continue rifaximin  5. Paroxysmal atrial fibrillation -rate controlled on metoprolol - Currently not on anticoagulation,may be high risk for bleed  6. Hypertension  7. EtOH Abuse     Dr.JamaDarling, transplant hepatologist at [facility] has accepted the transfer. Dr. Theodoro Doing at [facility] who has accepted the patient and bed is available now  DISCHARGE CONDITIONS:  fair CONSULTS OBTAINED:  Treatment Team:  Jonathon Bellows, MD Lin Landsman, MD Efrain Sella, MD DRUG ALLERGIES:   Allergies  Allergen Reactions  . Other    DISCHARGE MEDICATIONS:   Allergies as of 05/18/2019      Reactions   Other       Medication List    TAKE these medications   albumin human 5 % bottle Inject 500 mLs (25 g total) into the vein 2 (two) times daily.   fluticasone 50 MCG/ACT nasal spray Commonly known as: FLONASE Place 2 sprays into both nostrils daily as needed.   lactulose 10 GM/15ML solution Commonly known as: CHRONULAC Take 15 mLs (10 g total) by mouth 2 (two) times daily.   levocetirizine 5 MG tablet Commonly known as: XYZAL Take 5 mg by mouth daily.   metoprolol tartrate 25 MG tablet Commonly known as: LOPRESSOR Take 1 tablet by mouth 2 (two) times daily.   midodrine 10 MG tablet Commonly known as: PROAMATINE Take 1 tablet (10 mg total) by mouth 3 (three) times daily with meals.   multivitamin with minerals Tabs tablet Take 1 tablet by mouth daily.   octreotide 100 MCG/ML Soln injection Commonly known as: SANDOSTATIN Inject 2 mLs (  200 mcg total) into the skin 3 (three) times daily.   ondansetron 8 MG tablet Commonly known as: ZOFRAN Take 1 tablet by mouth every 8 (eight) hours as needed.   potassium chloride SA  20 MEQ tablet Commonly known as: KLOR-CON Take 2 tablets (40 mEq total) by mouth once for 1 dose.   prednisoLONE 15 MG/5ML solution Commonly known as: ORAPRED Take 13.3 mLs (40 mg total) by mouth daily before breakfast. Start taking on: May 19, 2019   rifaximin 550 MG Tabs tablet Commonly known as: XIFAXAN Take 1 tablet (550 mg total) by mouth 2 (two) times daily.       DISCHARGE INSTRUCTIONS:   DIET:  Renal diet DISCHARGE CONDITION:  Serious ACTIVITY:  Activity as tolerated OXYGEN:  Home Oxygen: No.  Oxygen Delivery: room air DISCHARGE LOCATION:  [facility]   If you experience worsening of your admission symptoms, develop shortness of breath, life threatening emergency, suicidal or homicidal thoughts you must seek medical attention immediately by calling 911 or calling your MD immediately  if symptoms less severe.  You Must read complete instructions/literature along with all the possible adverse reactions/side effects for all the Medicines you take and that have been prescribed to you. Take any new Medicines after you have completely understood and accpet all the possible adverse reactions/side effects.   Please note  You were cared for by a hospitalist during your hospital stay. If you have any questions about your discharge medications or the care you received while you were in the hospital after you are discharged, you can call the unit and asked to speak with the hospitalist on call if the hospitalist that took care of you is not available. Once you are discharged, your primary care physician will handle any further medical issues. Please note that NO REFILLS for any discharge medications will be authorized once you are discharged, as it is imperative that you return to your primary care physician (or establish a relationship with a primary care physician if you do not have one) for your aftercare needs so that they can reassess your need for medications and monitor your lab  values.    On the day of Discharge:  VITAL SIGNS:  Blood pressure (!) 156/107, pulse 72, temperature 97.6 F (36.4 C), temperature source Oral, resp. rate 18, height 5\' 4"  (1.626 m), weight 96 kg, SpO2 98 %. PHYSICAL EXAMINATION:  GENERAL:  58 y.o.-year-old patient lying in the bed with no acute distress.  EYES: Pupils equal, round, reactive to light and accommodation. No scleral icterus. Extraocular muscles intact.  HEENT: Head atraumatic, normocephalic. Oropharynx and nasopharynx clear.  NECK:  Supple, no jugular venous distention. No thyroid enlargement, no tenderness.  LUNGS: Normal breath sounds bilaterally, no wheezing, rales,rhonchi or crepitation. No use of accessory muscles of respiration.  CARDIOVASCULAR: S1, S2 normal. No murmurs, rubs, or gallops.  ABDOMEN: Soft, non-tender, non-distended. Bowel sounds present. No organomegaly or mass.  EXTREMITIES: No pedal edema, cyanosis, or clubbing.  NEUROLOGIC: Cranial nerves II through XII are intact. Muscle strength 5/5 in all extremities. Sensation intact. Gait not checked.  PSYCHIATRIC: The patient is alert and oriented x 3.  SKIN: No obvious rash, lesion, or ulcer.  DATA REVIEW:   CBC Recent Labs  Lab 05/18/19 0553  WBC 14.0*  HGB 10.3*  HCT 29.2*  PLT 153    Chemistries  Recent Labs  Lab 05/15/19 0557  05/18/19 0553  NA 132*   < > 133*  K 3.4*   < >  3.1*  CL 96*   < > 103  CO2 21*   < > 20*  GLUCOSE 78   < > 121*  BUN 64*   < > 71*  CREATININE 1.96*   < > 1.81*  CALCIUM 7.8*   < > 7.7*  MG 1.8  --   --   AST 120*   < > 63*  ALT 48*   < > 36  ALKPHOS 128*   < > 104  BILITOT 17.4*   < > 13.2*   < > = values in this interval not displayed.        RADIOLOGY:  Dg Chest Port 1 View  Result Date: 05/18/2019 CLINICAL DATA:  Cough EXAM: PORTABLE CHEST 1 VIEW COMPARISON:  05/15/2019 FINDINGS: Cardiac shadow is stable. Mild bibasilar atelectatic changes are noted slightly greater on the left than the right. No  focal confluent infiltrate is seen. No bony abnormality is noted. IMPRESSION: Mild bibasilar atelectasis. Electronically Signed   By: Alcide Clever M.D.   On: 05/18/2019 14:25     Management plans discussed with the patient, family and they are in agreement.  CODE STATUS: Full Code   TOTAL TIME TAKING CARE OF THIS PATIENT: 45 minutes.    Delfino Lovett M.D on 05/18/2019 at 4:47 PM  Between 7am to 6pm - Pager - [phone]  After 6pm go to [email or web address] - Scientist, research (life sciences) [facility]  Office  [phone]  CC: Primary care physician; Lauro Regulus, MD   Note: This dictation was prepared with Dragon dictation along with smaller phrase technology. Any transcriptional errors that result from this process are unintentional.

## 2019-05-18 NOTE — Progress Notes (Signed)
Pt discharged to [facility] via [facility]. Wife at bedside at time of transfer. Belongings returned.

## 2019-05-18 NOTE — Progress Notes (Signed)
1        [employer] [facility]   PATIENT NAME: Melvin Stone    MR#:  [id number]  DATE OF BIRTH:  January 28, 1961  SUBJECTIVE:  CHIEF COMPLAINT:   Chief Complaint  Patient presents with  . Abdominal Pain  Continues to feel tired, abdominal pain +, jaundice, ammonia trending up REVIEW OF SYSTEMS:  Review of Systems  Constitutional: Positive for malaise/fatigue. Negative for diaphoresis, fever and weight loss.  HENT: Negative for ear discharge, ear pain, hearing loss, nosebleeds, sore throat and tinnitus.   Eyes: Negative for blurred vision and pain.  Respiratory: Positive for shortness of breath. Negative for cough, hemoptysis and wheezing.   Cardiovascular: Negative for chest pain, palpitations, orthopnea and leg swelling.  Gastrointestinal: Positive for abdominal pain. Negative for blood in stool, constipation, diarrhea, heartburn, nausea and vomiting.  Genitourinary: Negative for dysuria, frequency and urgency.  Musculoskeletal: Negative for back pain and myalgias.  Skin: Negative for itching and rash.  Neurological: Negative for dizziness, tingling, tremors, focal weakness, seizures, weakness and headaches.  Psychiatric/Behavioral: Negative for depression. The patient is not nervous/anxious.    DRUG ALLERGIES:   Allergies  Allergen Reactions  . Other    VITALS:  Blood pressure 95/70, pulse 71, temperature 98.5 F (36.9 C), temperature source Oral, resp. rate 16, height 5\' 4"  (1.626 m), weight 96 kg, SpO2 98 %. PHYSICAL EXAMINATION:  Physical Exam Constitutional:      Appearance: He is obese. He is ill-appearing.  HENT:     Head: Normocephalic and atraumatic.  Eyes:     General: Scleral icterus present.     Conjunctiva/sclera: Conjunctivae normal.     Pupils: Pupils are equal, round, and reactive to light.  Neck:     Musculoskeletal: Normal range of motion and neck supple.     Thyroid: No thyromegaly.     Trachea: No tracheal deviation.   Cardiovascular:     Rate and Rhythm: Normal rate and regular rhythm.     Heart sounds: Normal heart sounds.  Pulmonary:     Effort: Pulmonary effort is normal. No respiratory distress.     Breath sounds: Normal breath sounds. No wheezing.  Chest:     Chest wall: No tenderness.  Abdominal:     General: Bowel sounds are normal. There is no distension.     Palpations: There is shifting dullness and fluid wave.     Tenderness: There is no abdominal tenderness.  Musculoskeletal: Normal range of motion.  Skin:    General: Skin is warm and dry.     Findings: No rash.  Neurological:     Mental Status: He is alert and oriented to person, place, and time.     Cranial Nerves: No cranial nerve deficit.    LABORATORY PANEL:  Male CBC Recent Labs  Lab 05/18/19 0553  WBC 14.0*  HGB 10.3*  HCT 29.2*  PLT 153   ------------------------------------------------------------------------------------------------------------------ Chemistries  Recent Labs  Lab 05/15/19 0557  05/18/19 0553  NA 132*   < > 133*  K 3.4*   < > 3.1*  CL 96*   < > 103  CO2 21*   < > 20*  GLUCOSE 78   < > 121*  BUN 64*   < > 71*  CREATININE 1.96*   < > 1.81*  CALCIUM 7.8*   < > 7.7*  MG 1.8  --   --   AST 120*   < > 63*  ALT 48*   < >  36  ALKPHOS 128*   < > 104  BILITOT 17.4*   < > 13.2*   < > = values in this interval not displayed.   RADIOLOGY:  No results found. ASSESSMENT AND PLAN:  58 y.o.malewith pertinent past medical history of EtOH abuse, tremors, ataxia, paroxysmal atrial fibrillation, alcoholic hepatitis, hypertension, OSA, diabetes mellitus, obesity status post gastric bypass admitted for abdominal tightness, shortness of breath and generalized fatigue  1. Acute severe alcoholic liver disease-patient presenting with jaundice, icterus, elevated LFTs and bilirubin levels, coagulopathy with elevated INR  -Ammonia level is trending up now, on rifaximin twice a day, add lactulose 3 times a day  and recheck ammonia tomorrow - His LFTs are consistent with severe alcoholic hepatitis -According to GI his 30-day mortality is >50% based on the score -Continue prednisone 40 mg once daily - day 3 -Continue midodrine 10 mg 3 times daily, octreotide 200 mg subcut 3 times daily along with IV albumin due to worsening kidney function, goal to maintain MAPs>70 - Hepatitis panel is negative - GI following - ceftriaxone for SBP prophylaxis - Continue multivitamin plus thiamine plus folate Encourage p.o. nutrition and protein supplements 3 times a day  2. Ascites due to alcoholic cirrhosis -Status post IR paracentesis with removal of 2 L of fluid on 9/29 - SBP Prophylaxis with Ceftriaxone 1g IV daily   3. AKI - Hepatorenal syndrome - Hold nephrotoxins, renal function remains stable -nephrology following - Continue to monitor renal functions  4. Hyperammonemia -secondary to above, no evidence of hepatic encephalopathy - Lactulose restarted, last ammonia of 66->31-> 47-> 72 - Continue to trend ammonia levels -Continue rifaximin  5. Paroxysmal atrial fibrillation -rate controlled on metoprolol - Currently not on anticoagulation, may be high risk for bleed  6. Hypertension -continue metoprolol  7. EtOH Abuse - Last Drink 3 weeks ago - Daily Thiamine, Folate, MVI  - SW consult for cessation resources - PT/OT evaluation for mobility - CIWA +/- Standing Protocol     Dr. Rayvon Stone, transplant hepatologist at [facility] has accepted the transfer.  I had discussion with hospitalist Dr. Theodoro Stone at [facility] who has accepted the patient this morning.  Still waiting for bed. They do not have beds the patient is placed on the waiting list.   All the records are reviewed and case discussed with Care Management/Social Worker. Management plans discussed with the patient, nursing and they are in agreement.  CODE STATUS: Full Code  TOTAL TIME TAKING CARE OF THIS PATIENT: 35 minutes.    More than 50% of the time was spent in counseling/coordination of care: YES  Waiting for [facility] transfer   Melvin Stone M.D on 05/18/2019 at 11:24 AM  Between 7am to 6pm - Pager - [phone]  After 6pm go to [email or web address] - Technical brewer [facility]  Office  [phone]  CC: Primary care physician; Kirk Ruths, MD  Note: This dictation was prepared with Dragon dictation along with smaller phrase technology. Any transcriptional errors that result from this process are unintentional.

## 2019-05-18 NOTE — Progress Notes (Signed)
[facility]  ROUNDING NOTE   Subjective:   Son at bedside.   Waiting on transfer to [facility].   Objective:  Vital signs in last 24 hours:  Temp:  [98.5 F (36.9 C)] 98.5 F (36.9 C) (10/02 0526) Pulse Rate:  [71-72] 71 (10/02 0526) Resp:  [16] 16 (10/02 0526) BP: (95-113)/(70-81) 95/70 (10/02 0526) SpO2:  [98 %] 98 % (10/02 0526)  Weight change:  Filed Weights   05/15/19 0201 05/15/19 0552  Weight: 84.4 kg 96 kg    Intake/Output: I/O last 3 completed shifts: In: 773 [P.O.:30; IV Piggyback:743] Out: 325 [Urine:325]   Intake/Output this shift:  No intake/output data recorded.  Physical Exam: General: NAD,   Head: Normocephalic, atraumatic. Moist oral mucosal membranes  Eyes: Anicteric, PERRL  Neck: +JVD  Lungs:  Clear to auscultation  Heart: Regular rate and rhythm  Abdomen:  +distended, nontender  Extremities: + peripheral edema.  Neurologic: No asterixis  Skin: No lesions        Basic Metabolic Panel: Recent Labs  Lab 05/15/19 0211 05/15/19 0557 05/16/19 0559 05/17/19 0330 05/18/19 0553  NA 132* 132* 134* 132* 133*  K 3.4* 3.4* 3.7 3.1* 3.1*  CL 96* 96* 99 101 103  CO2 21* 21* 22 21* 20*  GLUCOSE 84 78 172* 115* 121*  BUN 61* 64* 66* 67* 71*  CREATININE 1.88* 1.96* 1.95* 1.94* 1.81*  CALCIUM 7.9* 7.8* 7.8* 7.9* 7.7*  MG  --  1.8  --   --   --   PHOS  --  4.4  --   --   --     Liver Function Tests: Recent Labs  Lab 05/15/19 0504 05/15/19 0557 05/16/19 0918 05/17/19 0330 05/18/19 0553  AST 116* 120* 98* 74* 63*  ALT 43 48* 38 34 36  ALKPHOS 118 128* 102 99 104  BILITOT 15.5* 17.4* 18.0* 15.1* 13.2*  PROT 6.2* 6.8 6.5 6.0* 5.6*  ALBUMIN 1.3* 1.5* 1.8* 1.8* 1.7*   Recent Labs  Lab 05/15/19 0211  LIPASE 38   Recent Labs  Lab 05/16/19 0918 05/17/19 1632 05/18/19 0553  AMMONIA 31 47* 72*    CBC: Recent Labs  Lab 05/15/19 0211 05/15/19 0557 05/16/19 0559 05/17/19 0330 05/18/19 0553  WBC 14.2* 13.9* 8.6 16.1* 14.0*   NEUTROABS 10.4*  --   --   --   --   HGB 13.3 13.2 10.1* 10.3* 10.3*  HCT 37.6* 36.7* 28.3* 29.0* 29.2*  MCV 99.2 96.3 96.9 97.6 98.6  PLT 177 160 136* 148* 153    Cardiac Enzymes: No results for input(s): CKTOTAL, CKMB, CKMBINDEX, TROPONINI in the last 168 hours.  BNP: Invalid input(s): POCBNP  CBG: No results for input(s): GLUCAP in the last 168 hours.  Microbiology: Results for orders placed or performed during the hospital encounter of 05/15/19  SARS Coronavirus 2 [facility] order, Performed in [facility] hospital lab) Nasopharyngeal Nasopharyngeal Swab     Status: None   Collection Time: 05/15/19  2:11 AM   Specimen: Nasopharyngeal Swab  Result Value Ref Range Status   SARS Coronavirus 2 NEGATIVE NEGATIVE Final    Comment: (NOTE) If result is NEGATIVE SARS-CoV-2 target nucleic acids are NOT DETECTED. The SARS-CoV-2 RNA is generally detectable in upper and lower  respiratory specimens during the acute phase of infection. The lowest  concentration of SARS-CoV-2 viral copies this assay can detect is 250  copies / mL. A negative result does not preclude SARS-CoV-2 infection  and should not be used as  the sole basis for treatment or other  patient management decisions.  A negative result may occur with  improper specimen collection / handling, submission of specimen other  than nasopharyngeal swab, presence of viral mutation(s) within the  areas targeted by this assay, and inadequate number of viral copies  (<250 copies / mL). A negative result must be combined with clinical  observations, patient history, and epidemiological information. If result is POSITIVE SARS-CoV-2 target nucleic acids are DETECTED. The SARS-CoV-2 RNA is generally detectable in upper and lower  respiratory specimens dur ing the acute phase of infection.  Positive  results are indicative of active infection with SARS-CoV-2.  Clinical  correlation with patient history and other diagnostic information  is  necessary to determine patient infection status.  Positive results do  not rule out bacterial infection or co-infection with other viruses. If result is PRESUMPTIVE POSTIVE SARS-CoV-2 nucleic acids MAY BE PRESENT.   A presumptive positive result was obtained on the submitted specimen  and confirmed on repeat testing.  While 2019 novel coronavirus  (SARS-CoV-2) nucleic acids may be present in the submitted sample  additional confirmatory testing may be necessary for epidemiological  and / or clinical management purposes  to differentiate between  SARS-CoV-2 and other Sarbecovirus currently known to infect humans.  If clinically indicated additional testing with an alternate test  methodology [phone]) is advised. The SARS-CoV-2 RNA is generally  detectable in upper and lower respiratory sp ecimens during the acute  phase of infection. The expected result is Negative. Fact Sheet for Patients:  StrictlyIdeas.no Fact Sheet for Healthcare Providers: BankingDealers.co.za This test is not yet approved or cleared by the [place] FDA and has been authorized for detection and/or diagnosis of SARS-CoV-2 by FDA under an Emergency Use Authorization (EUA).  This EUA will remain in effect (meaning this test can be used) for the duration of the COVID-19 declaration under Section 564(b)(1) of the Act, 21 U.S.C. section 360bbb-3(b)(1), unless the authorization is terminated or revoked sooner. Performed at [facility], [address] [place], [place] [address]   Body fluid culture     Status: None (Preliminary result)   Collection Time: 05/15/19 10:06 AM   Specimen: PATH Cytology Peritoneal fluid  Result Value Ref Range Status   Specimen Description   Final    PERITONEAL Performed at [facility], [address], [place], [place] [address]    Special Requests   Final    NONE Performed at [facility], [address] [place], [place] [address]    Gram Stain   Final    WBC PRESENT,BOTH PMN AND MONONUCLEAR NO ORGANISMS SEEN CYTOSPIN SMEAR    Culture   Final    NO GROWTH 3 DAYS Performed at [facility], [place] [address] [place], [place] [address]    Report Status PENDING  Incomplete  Culture, blood (routine x 2)     Status: None (Preliminary result)   Collection Time: 05/15/19  3:13 PM   Specimen: BLOOD  Result Value Ref Range Status   Specimen Description BLOOD LEFT ANTECUBITAL  Final   Special Requests   Final    BOTTLES DRAWN AEROBIC AND ANAEROBIC Blood Culture results may not be optimal due to an inadequate volume of blood received in culture bottles   Culture   Final    NO GROWTH 3 DAYS Performed at [facility], [address], [place], [place] [address]    Report Status PENDING  Incomplete  Culture, blood (routine x 2)     Status: None (Preliminary result)   Collection Time: 05/15/19  3:21 PM   Specimen: BLOOD  Result Value Ref Range Status   Specimen Description BLOOD BLOOD LEFT HAND  Final   Special Requests   Final    BOTTLES DRAWN AEROBIC AND ANAEROBIC Blood Culture adequate volume   Culture   Final    NO GROWTH 3 DAYS Performed at [facility], [address] [place], [place] [address]    Report Status PENDING  Incomplete    Coagulation Studies: No results for input(s): LABPROT, INR in the last 72 hours.  Urinalysis: No results for input(s): COLORURINE, LABSPEC, PHURINE, GLUCOSEU, HGBUR, BILIRUBINUR, KETONESUR, PROTEINUR, UROBILINOGEN, NITRITE, LEUKOCYTESUR in the last 72 hours.  Invalid input(s): APPERANCEUR    Imaging: No results found.   Medications:   . albumin human 25 g (05/18/19 0944)  . cefTRIAXone (ROCEPHIN)  IV 1 g (05/17/19 2029)   . feeding supplement  1 Container Oral TID BM  . folic acid  1 mg Oral Daily  . lactulose  10 g Oral BID  . loratadine  10 mg Oral Daily  . midodrine  10 mg Oral TID WC  .  multivitamin with minerals  1 tablet Oral Daily  . octreotide  200 mcg Subcutaneous TID  . pneumococcal 23 valent vaccine  0.5 mL Intramuscular Tomorrow-1000  . prednisoLONE  40 mg Oral QAC breakfast  . rifaximin  550 mg Oral BID  . thiamine  100 mg Oral Daily   butalbital-acetaminophen-caffeine, fluticasone  Assessment/ Plan:  Mr. Melvin Stone is a 58 y.o. hispanic male Mr. Melvin Dot Beenineiro is a 58 y.o. Hispanic male with alcoholic hepatic cirrhosis, gastric bypass, atrial fibrillation, hypertension, tremor currently undergoing work up for Parkinson's , who was admitted to [facility] on 05/15/2019 for acute liver failure.  Underwent large volume paracentesis on admission with 2 liters removed.   1. Acute renal failure: baseline creatinine of 0.6 normal GFR on 01/2019.  2. Hyponatremia 3. Hypokalemia 4. Metabolic acidosis 5. Fulminant Hepatic failure 6. 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  - Prednisolone - IV albumin  - ceftriaxone for SBP prophylaxis.   Overall prognosis is poor. No indication for dialysis at this time.  Appreciate GI and palliative care input.    LOS: 3 Melvin Stone 10/2/202011:06 AM

## 2019-05-18 NOTE — Progress Notes (Signed)
[facility] to transport. Awaiting fax to secure transportation. Oncoming RN made aware. Madlyn Frankel, RN

## 2019-05-18 NOTE — Progress Notes (Signed)
Bed assigned at [facility] - report called to Neth B. Madlyn Frankel, RN

## 2019-05-18 NOTE — Progress Notes (Signed)
MD notified due to pt asking for something for headache. Pt is awaiting tx to [facility] now. MD acknowledged, no further orders at this time. Would like to wait until pt is evaluated at [facility].

## 2019-05-19 LAB — COPPER, SERUM: Copper: 89 ug/dL (ref 72–166)

## 2019-05-19 LAB — ZINC: Zinc: 46 ug/dL — ABNORMAL LOW (ref 56–134)

## 2019-05-20 LAB — CULTURE, BLOOD (ROUTINE X 2)
Culture: NO GROWTH
Culture: NO GROWTH
Special Requests: ADEQUATE

## 2019-05-21 LAB — VITAMIN B1: Vitamin B1 (Thiamine): 119 nmol/L (ref 66.5–200.0)

## 2019-05-21 LAB — VITAMIN A: Vitamin A (Retinoic Acid): 10.4 ug/dL — ABNORMAL LOW (ref 20.1–62.0)

## 2019-05-21 LAB — MISC LABCORP TEST (SEND OUT)

## 2019-05-21 LAB — VITAMIN C: Vitamin C: 0.6 mg/dL (ref 0.4–2.0)

## 2019-05-21 LAB — VITAMIN E
Vitamin E (Alpha Tocopherol): 9.3 mg/L (ref 7.0–25.1)
Vitamin E(Gamma Tocopherol): 0.4 mg/L — ABNORMAL LOW (ref 0.5–5.5)

## 2019-05-21 MED ORDER — POLYETHYLENE GLYCOL 3350 17 G PO PACK
17.00 | PACK | ORAL | Status: DC
Start: ? — End: 2019-05-21

## 2019-05-21 MED ORDER — GENERIC EXTERNAL MEDICATION
2.00 | Status: DC
Start: ? — End: 2019-05-21

## 2019-05-21 MED ORDER — FOLIC ACID 1 MG PO TABS
1.00 | ORAL_TABLET | ORAL | Status: DC
Start: 2019-05-22 — End: 2019-05-21

## 2019-05-21 MED ORDER — MIDODRINE HCL 5 MG PO TABS
5.00 | ORAL_TABLET | ORAL | Status: DC
Start: 2019-05-21 — End: 2019-05-21

## 2019-05-21 MED ORDER — PHYTONADIONE 5 MG PO TABS
10.00 | ORAL_TABLET | ORAL | Status: DC
Start: 2019-05-21 — End: 2019-05-21

## 2019-05-21 MED ORDER — OXYCODONE HCL 5 MG PO TABS
5.00 | ORAL_TABLET | ORAL | Status: DC
Start: ? — End: 2019-05-21

## 2019-05-21 MED ORDER — RIFAXIMIN 550 MG PO TABS
550.00 | ORAL_TABLET | ORAL | Status: DC
Start: 2019-05-21 — End: 2019-05-21

## 2019-05-21 MED ORDER — OCTREOTIDE ACETATE 500 MCG/ML IJ SOLN
200.00 | INTRAMUSCULAR | Status: DC
Start: 2019-05-21 — End: 2019-05-21

## 2019-05-21 MED ORDER — GENERIC EXTERNAL MEDICATION
100.00 | Status: DC
Start: 2019-05-22 — End: 2019-05-21

## 2019-05-21 MED ORDER — LACTULOSE 10 GM/15ML PO SOLN
10.00 | ORAL | Status: DC
Start: 2019-05-21 — End: 2019-05-21

## 2019-05-21 MED ORDER — ALBUMIN HUMAN 25 % IV SOLN
25.00 | INTRAVENOUS | Status: DC
Start: 2019-05-21 — End: 2019-05-21

## 2019-05-21 MED ORDER — INFLUENZA VAC SPLIT QUAD 0.5 ML IM SUSY
0.50 | PREFILLED_SYRINGE | INTRAMUSCULAR | Status: DC
Start: ? — End: 2019-05-21

## 2019-05-21 MED ORDER — ACETAMINOPHEN 325 MG PO TABS
650.00 | ORAL_TABLET | ORAL | Status: DC
Start: ? — End: 2019-05-21

## 2019-05-21 MED ORDER — PREDNISOLONE SODIUM PHOSPHATE 10 MG PO TBDP
40.00 | ORAL_TABLET | ORAL | Status: DC
Start: 2019-05-22 — End: 2019-05-21

## 2019-05-21 MED ORDER — ALBUMIN HUMAN 25 % IV SOLN
25.00 | INTRAVENOUS | Status: DC
Start: 2019-05-22 — End: 2019-05-21

## 2019-05-21 MED ORDER — GENERIC EXTERNAL MEDICATION
Status: DC
Start: ? — End: 2019-05-21

## 2019-05-21 MED ORDER — FLUTICASONE PROPIONATE 50 MCG/ACT NA SUSP
1.00 | NASAL | Status: DC
Start: ? — End: 2019-05-21

## 2019-05-21 MED ORDER — MELATONIN 3 MG PO TABS
6.00 | ORAL_TABLET | ORAL | Status: DC
Start: ? — End: 2019-05-21

## 2019-05-22 ENCOUNTER — Telehealth: Payer: Self-pay

## 2019-05-22 NOTE — Telephone Encounter (Signed)
-----   Message from Lin Landsman, MD sent at 05/21/2019  5:12 PM EDT ----- Please call patient's wife and make her aware about results of the labs done at [facility] when her husband was admitted here.  Advise her to let the hospitalist or the hepatologist at [facility] to check his results on care everywhere.  Patient is currently admitted at [facility]  Results released via my chart  Rohini Vanga

## 2019-05-22 NOTE — Telephone Encounter (Signed)
Patient verbalized understanding of  Lab results and will follow up with [facility]

## 2019-05-24 LAB — VITAMIN K1, SERUM: VITAMIN K1: 0.63 ng/mL (ref 0.13–1.88)

## 2019-06-25 ENCOUNTER — Emergency Department
Admission: EM | Admit: 2019-06-25 | Discharge: 2019-06-26 | Disposition: A | Discharge status: Skilled Nursing Facility | Payer: BC Managed Care – PPO | Attending: Emergency Medicine | Admitting: Emergency Medicine

## 2019-06-25 ENCOUNTER — Other Ambulatory Visit: Payer: Self-pay

## 2019-06-25 DIAGNOSIS — Z20828 Contact with and (suspected) exposure to other viral communicable diseases: Secondary | ICD-10-CM | POA: Diagnosis not present

## 2019-06-25 DIAGNOSIS — R531 Weakness: Secondary | ICD-10-CM

## 2019-06-25 DIAGNOSIS — R188 Other ascites: Secondary | ICD-10-CM | POA: Insufficient documentation

## 2019-06-25 DIAGNOSIS — I10 Essential (primary) hypertension: Secondary | ICD-10-CM | POA: Diagnosis not present

## 2019-06-25 DIAGNOSIS — K7460 Unspecified cirrhosis of liver: Secondary | ICD-10-CM | POA: Diagnosis not present

## 2019-06-25 HISTORY — DX: Unspecified kidney failure: N19

## 2019-06-25 HISTORY — DX: Hepatic failure, unspecified without coma: K72.90

## 2019-06-25 LAB — URINALYSIS, COMPLETE (UACMP) WITH MICROSCOPIC
Bilirubin Urine: NEGATIVE
Glucose, UA: NEGATIVE mg/dL
Hgb urine dipstick: NEGATIVE
Ketones, ur: NEGATIVE mg/dL
Leukocytes,Ua: NEGATIVE
Nitrite: NEGATIVE
Protein, ur: NEGATIVE mg/dL
Specific Gravity, Urine: 1.014 (ref 1.005–1.030)
pH: 5 (ref 5.0–8.0)

## 2019-06-25 LAB — COMPREHENSIVE METABOLIC PANEL
ALT: 16 U/L (ref 0–44)
AST: 40 U/L (ref 15–41)
Albumin: 4 g/dL (ref 3.5–5.0)
Alkaline Phosphatase: 66 U/L (ref 38–126)
Anion gap: 12 (ref 5–15)
BUN: 42 mg/dL — ABNORMAL HIGH (ref 6–20)
CO2: 17 mmol/L — ABNORMAL LOW (ref 22–32)
Calcium: 8.8 mg/dL — ABNORMAL LOW (ref 8.9–10.3)
Chloride: 108 mmol/L (ref 98–111)
Creatinine, Ser: 1.54 mg/dL — ABNORMAL HIGH (ref 0.61–1.24)
GFR calc Af Amer: 57 mL/min — ABNORMAL LOW (ref >60)
GFR calc non Af Amer: 49 mL/min — ABNORMAL LOW (ref >60)
Glucose, Bld: 125 mg/dL — ABNORMAL HIGH (ref 70–99)
Potassium: 3.5 mmol/L (ref 3.5–5.1)
Sodium: 137 mmol/L (ref 135–145)
Total Bilirubin: 8.6 mg/dL — ABNORMAL HIGH (ref 0.3–1.2)
Total Protein: 7 g/dL (ref 6.5–8.1)

## 2019-06-25 LAB — TROPONIN I (HIGH SENSITIVITY)
Troponin I (High Sensitivity): 8 ng/L (ref <18)
Troponin I (High Sensitivity): 7 ng/L (ref <18)

## 2019-06-25 LAB — CBC WITH DIFFERENTIAL/PLATELET
Abs Immature Granulocytes: 0.14 10*3/uL — ABNORMAL HIGH (ref 0.00–0.07)
Basophils Absolute: 0.1 10*3/uL (ref 0.0–0.1)
Basophils Relative: 1 %
Eosinophils Absolute: 0.5 10*3/uL (ref 0.0–0.5)
Eosinophils Relative: 3 %
HCT: 32.2 % — ABNORMAL LOW (ref 39.0–52.0)
Hemoglobin: 11.1 g/dL — ABNORMAL LOW (ref 13.0–17.0)
Immature Granulocytes: 1 %
Lymphocytes Relative: 31 %
Lymphs Abs: 4.5 10*3/uL — ABNORMAL HIGH (ref 0.7–4.0)
MCH: 34 pg (ref 26.0–34.0)
MCHC: 34.5 g/dL (ref 30.0–36.0)
MCV: 98.8 fL (ref 80.0–100.0)
Monocytes Absolute: 1.2 10*3/uL — ABNORMAL HIGH (ref 0.1–1.0)
Monocytes Relative: 8 %
Neutro Abs: 8.4 10*3/uL — ABNORMAL HIGH (ref 1.7–7.7)
Neutrophils Relative %: 56 %
Platelets: 137 10*3/uL — ABNORMAL LOW (ref 150–400)
RBC: 3.26 MIL/uL — ABNORMAL LOW (ref 4.22–5.81)
RDW: 18.6 % — ABNORMAL HIGH (ref 11.5–15.5)
Smear Review: NORMAL
WBC: 14.7 10*3/uL — ABNORMAL HIGH (ref 4.0–10.5)
nRBC: 0 % (ref 0.0–0.2)

## 2019-06-25 LAB — PROTIME-INR
INR: 2.3 — ABNORMAL HIGH (ref 0.8–1.2)
Prothrombin Time: 24.6 seconds — ABNORMAL HIGH (ref 11.4–15.2)

## 2019-06-25 LAB — AMMONIA: Ammonia: 43 umol/L — ABNORMAL HIGH (ref 9–35)

## 2019-06-25 MED ORDER — CARVEDILOL 3.125 MG PO TABS
3.13 | ORAL_TABLET | ORAL | Status: DC
Start: 2019-06-24 — End: 2019-06-25

## 2019-06-25 MED ORDER — HYDROMORPHONE HCL 1 MG/ML IJ SOLN
1.0000 mg | Freq: Once | INTRAMUSCULAR | Status: AC
  Administered 2019-06-25: 13:00:00 1 mg via INTRAVENOUS
  Filled 2019-06-25: qty 1

## 2019-06-25 MED ORDER — FUROSEMIDE 20 MG PO TABS
20.00 | ORAL_TABLET | ORAL | Status: DC
Start: 2019-06-25 — End: 2019-06-25

## 2019-06-25 MED ORDER — FOLIC ACID 1 MG PO TABS
1.00 | ORAL_TABLET | ORAL | Status: DC
Start: 2019-06-25 — End: 2019-06-25

## 2019-06-25 MED ORDER — BISACODYL 5 MG PO TBEC
10.00 | DELAYED_RELEASE_TABLET | ORAL | Status: DC
Start: ? — End: 2019-06-25

## 2019-06-25 MED ORDER — ACETAMINOPHEN 325 MG PO TABS
650.00 | ORAL_TABLET | ORAL | Status: DC
Start: 2019-06-24 — End: 2019-06-25

## 2019-06-25 MED ORDER — MELATONIN 3 MG PO TABS
3.00 | ORAL_TABLET | ORAL | Status: DC
Start: ? — End: 2019-06-25

## 2019-06-25 MED ORDER — LACTATED RINGERS IV SOLN
10.00 | INTRAVENOUS | Status: DC
Start: ? — End: 2019-06-25

## 2019-06-25 MED ORDER — GENERIC EXTERNAL MEDICATION
100.00 | Status: DC
Start: 2019-06-25 — End: 2019-06-25

## 2019-06-25 MED ORDER — LORAZEPAM 2 MG/ML IJ SOLN
0.50 | INTRAMUSCULAR | Status: DC
Start: ? — End: 2019-06-25

## 2019-06-25 MED ORDER — MIDODRINE HCL 10 MG PO TABS
10.00 | ORAL_TABLET | ORAL | Status: DC
Start: 2019-06-24 — End: 2019-06-25

## 2019-06-25 MED ORDER — SPIRONOLACTONE 25 MG PO TABS
50.00 | ORAL_TABLET | ORAL | Status: DC
Start: 2019-06-25 — End: 2019-06-25

## 2019-06-25 MED ORDER — RIFAXIMIN 550 MG PO TABS
550.00 | ORAL_TABLET | ORAL | Status: DC
Start: 2019-06-24 — End: 2019-06-25

## 2019-06-25 MED ORDER — GENERIC EXTERNAL MEDICATION
12.50 | Status: DC
Start: ? — End: 2019-06-25

## 2019-06-25 MED ORDER — LIDOCAINE 5 % EX PTCH
1.00 | MEDICATED_PATCH | CUTANEOUS | Status: DC
Start: ? — End: 2019-06-25

## 2019-06-25 MED ORDER — LACTULOSE 10 GM/15ML PO SOLN
30.00 | ORAL | Status: DC
Start: 2019-06-24 — End: 2019-06-25

## 2019-06-25 MED ORDER — ZINC SULFATE 220 (50 ZN) MG PO CAPS
220.00 | ORAL_CAPSULE | ORAL | Status: DC
Start: 2019-06-25 — End: 2019-06-25

## 2019-06-25 MED ORDER — CARBOXYMETHYLCELLULOSE SODIUM 0.25 % OP SOLN
2.00 | OPHTHALMIC | Status: DC
Start: ? — End: 2019-06-25

## 2019-06-25 MED ORDER — ONDANSETRON HCL 4 MG/2ML IJ SOLN
4.00 | INTRAMUSCULAR | Status: DC
Start: ? — End: 2019-06-25

## 2019-06-25 MED ORDER — SODIUM BICARBONATE 650 MG PO TABS
650.00 | ORAL_TABLET | ORAL | Status: DC
Start: 2019-06-24 — End: 2019-06-25

## 2019-06-25 NOTE — ED Notes (Signed)
Pt given water , ok per MD Jimmye Norman

## 2019-06-25 NOTE — ED Provider Notes (Signed)
[facility] Emergency Department Provider Note       Time seen: ----------------------------------------- 12:24 PM on 06/25/2019 -----------------------------------------   I have reviewed the triage vital signs and the nursing notes.  HISTORY   Chief Complaint No chief complaint on file.   HPI Melvin Stone is a 58 y.o. male with a history of hypertension, alcoholic cirrhosis, gastric bypass who presents to the ED for weakness, inability to stand and leg pain.  Patient was recently admitted at [facility] for liver failure and kidney failure.  Patient does not admit to significant or worsening edema.  He reports he is still making urine.  Past Medical History:  Diagnosis Date  . Hypertension     Patient Active Problem List   Diagnosis Date Noted  . Ascites due to alcoholic cirrhosis (HCC) 05/15/2019  . Chest pain 12/16/2016    Past Surgical History:  Procedure Laterality Date  . gastric bypass      Allergies Other  Social History Social History   Tobacco Use  . Smoking status: Never Smoker  . Smokeless tobacco: Never Used  Substance Use Topics  . Alcohol use: Yes    Alcohol/week: 4.0 standard drinks    Types: 4 Cans of beer per week  . Drug use: No    Review of Systems Constitutional: Negative for fever. Cardiovascular: Negative for chest pain. Respiratory: Negative for shortness of breath. Gastrointestinal: Negative for abdominal pain, vomiting and diarrhea. Musculoskeletal: Positive for leg pain Skin: Negative for rash. Neurological: Positive for weakness  All systems negative/normal/unremarkable except as stated in the HPI  ____________________________________________   PHYSICAL EXAM:  VITAL SIGNS: ED Triage Vitals  Enc Vitals Group     BP      Pulse      Resp      Temp      Temp src      SpO2      Weight      Height      Head Circumference      Peak Flow      Pain Score      Pain Loc      Pain Edu?      Excl. in GC?    Constitutional: Alert and oriented.  No distress Eyes: Conjunctivae are icteric normal extraocular movements. ENT      Head: Normocephalic and atraumatic.      Nose: No congestion/rhinnorhea.      Mouth/Throat: Mucous membranes are moist.      Neck: No stridor. Cardiovascular: Normal rate, regular rhythm. No murmurs, rubs, or gallops. Respiratory: Normal respiratory effort without tachypnea nor retractions. Breath sounds are clear and equal bilaterally. No wheezes/rales/rhonchi. Gastrointestinal: Soft and nontender. Normal bowel sounds Musculoskeletal: Bilateral pitting edema is noted Neurologic:  Normal speech and language. No gross focal neurologic deficits are appreciated.  Generalized weakness Skin:  Skin is warm, dry and intact.  Jaundice is noted, scattered bruises are noted Psychiatric: Mood and affect are normal. Speech and behavior are normal.  ____________________________________________  EKG: Interpreted by me.  Sinus rhythm with rate of 67 bpm, left bundle branch block, normal QT  ____________________________________________  ED COURSE:  As part of my medical decision making, I reviewed the following data within the electronic MEDICAL RECORD NUMBER History obtained from family if available, nursing notes, old chart and ekg, as well as notes from prior ED visits. Patient presented for weakness and pain in the setting of liver and renal failure, we will assess with labs and  imaging as indicated at this time.   Procedures  Melvin Stone was evaluated in Emergency Department on 06/25/2019 for the symptoms described in the history of present illness. He was evaluated in the context of the global COVID-19 pandemic, which necessitated consideration that the patient might be at risk for infection with the SARS-CoV-2 virus that causes COVID-19. Institutional protocols and algorithms that pertain to the evaluation of patients at risk for COVID-19 are in a state of rapid  change based on information released by regulatory bodies including the CDC and federal and state organizations. These policies and algorithms were followed during the patient's care in the ED.  ____________________________________________   LABS (pertinent positives/negatives)  Labs Reviewed  CBC WITH DIFFERENTIAL/PLATELET - Abnormal; Notable for the following components:      Result Value   WBC 14.7 (*)    RBC 3.26 (*)    Hemoglobin 11.1 (*)    HCT 32.2 (*)    RDW 18.6 (*)    Platelets 137 (*)    All other components within normal limits  COMPREHENSIVE METABOLIC PANEL - Abnormal; Notable for the following components:   CO2 17 (*)    Glucose, Bld 125 (*)    BUN 42 (*)    Creatinine, Ser 1.54 (*)    Calcium 8.8 (*)    Total Bilirubin 8.6 (*)    GFR calc non Af Amer 49 (*)    GFR calc Af Amer 57 (*)    All other components within normal limits  URINALYSIS, COMPLETE (UACMP) WITH MICROSCOPIC - Abnormal; Notable for the following components:   Color, Urine AMBER (*)    APPearance CLEAR (*)    Bacteria, UA RARE (*)    All other components within normal limits  PROTIME-INR - Abnormal; Notable for the following components:   Prothrombin Time 24.6 (*)    INR 2.3 (*)    All other components within normal limits  AMMONIA - Abnormal; Notable for the following components:   Ammonia 43 (*)    All other components within normal limits  TROPONIN I (HIGH SENSITIVITY)   ____________________________________________   DIFFERENTIAL DIAGNOSIS   Electrolyte abnormality, end-stage renal disease, liver failure, chronic pain, cirrhosis, peripheral edema  FINAL ASSESSMENT AND PLAN  Weakness, chronic kidney disease, cirrhosis, leg pain   Plan: The patient had presented for weakness and lower extremity pain. Patient's labs have not revealed any acute process.  I discussed with the family, originally the plan was to have him transferred back to [facility] for placement.  I will discuss with the social  worker for placement.  Patient will have a physical therapy evaluation as well.   Laurence Aly, MD    Note: This note was generated in part or whole with voice recognition software. Voice recognition is usually quite accurate but there are transcription errors that can and very often do occur. I apologize for any typographical errors that were not detected and corrected.     Earleen Newport, MD 06/25/19 1349

## 2019-06-25 NOTE — ED Notes (Signed)
Pt given warm blankets.

## 2019-06-25 NOTE — ED Notes (Signed)
EDT Jonni Sanger and RN Katie changed pt and placed new clean sheets on bed. Condom cath placed. Duo derm placed on right hip area over 1-2 inch open wound with some redness, no drainage present at this time (Date placed on bandage). Pt also has redness noted to left hip area, no open wounds noted at this time. Pt repositioned in bed.

## 2019-06-25 NOTE — ED Notes (Signed)
Pt repositioned in bed at this time

## 2019-06-25 NOTE — Evaluation (Signed)
Physical Therapy Evaluation Patient Details Name: Melvin Stone MRN: [id number] DOB: [birth year] Today's Date: 06/25/2019   History of Present Illness  Melvin Stone is a 58yoM who comes to [facility] on 11/9 with severe pain in bilat legs. Pt reports pain to be in the trochanteric area bilat. Pt was recently DC from [facility] after 2-3 week hospitalization, home for less than 1 day per CHL. DC recs were for STR, but at that time pt opted ot return to home with family.  Clinical Impression  Pt admitted with above diagnosis. Pt currently with functional limitations due to the deficits listed below (see "PT Problem List"). Upon entry, pt in bed, awake and agreeable to participate. The pt is alert and oriented x4, pleasant, conversational, and generally a fair historian but struggles with providing detail. Pt also struggle to explain in full his plan to overcome obvious limitations for returning home. Pt reports pain in the bilat trochanteric area, the left side with focal erythema over the greater trochanter. The area of pain positively correlates to a changes in soft tissue mobility, skin over the painful area is quite firm, suspect third space edema contributing. Author also questioning migration of ascites peritoneal fluid to the trochanteric area. The right side is more pronounced red with some weeping at two places and red areas of skin tearing, similar changes to soft tissue firmness as described similar to left side. Max+2 assist to EOB with severe pain. Muscle testing reveals pain and weakness in hips and knees in all directions. MinA to rise to standing, but pt too short to easily return to sitting EOB, author provides MaxA+2 for return to supine which is quite painful for patient. Functional mobility assessment demonstrates increased effort/time requirements, poor tolerance, and need for physical assistance, whereas the patient performed these at a higher level of independence PTA. Pt is home alone, cannot  perform bed mobility without extensive physical assist and poorly controlled pain. Pt not safe to perform transfers or AMB independently at this time. Pt will benefit from skilled PT intervention to increase independence and safety with basic mobility in preparation for discharge to the venue listed below.       Follow Up Recommendations SNF;Supervision - Intermittent;Supervision for mobility/OOB    Equipment Recommendations  None recommended by PT    Recommendations for Other Services       Precautions / Restrictions Precautions Precautions: Fall Restrictions Weight Bearing Restrictions: No      Mobility  Bed Mobility Overal bed mobility: Needs Assistance Bed Mobility: Supine to Sit;Sit to Supine;Rolling Rolling: Min assist   Supine to sit: Max assist Sit to supine: Max assist;+2 for physical assistance   General bed mobility comments: veyr labored and pain limited.  Transfers Overall transfer level: Needs assistance Equipment used: None;1 person hand held assist Transfers: Sit to/from Stand Sit to Stand: From elevated surface;Min assist         General transfer comment: able to balance once standing with intermitent BUE support  Ambulation/Gait Ambulation/Gait assistance: (deferred at this time 2/2 pain)              Stairs            Wheelchair Mobility    Modified Rankin (Stroke Patients Only)       Balance  Pertinent Vitals/Pain Pain Assessment: 0-10 Pain Score: 10-Worst pain ever Pain Location: bilat trochanteric pain Pain Intervention(s): Limited activity within patient's tolerance;Monitored during session;Repositioned    Home Living Family/patient expects to be discharged to:: Private residence Living Arrangements: Spouse/significant other Available Help at Discharge: Family Type of Home: House Home Access: Stairs to enter Entrance Stairs-Rails: Left Entrance  Stairs-Number of Steps: 3-4 Home Layout: One level Home Equipment: Walker - 2 wheels;Bedside commode;Shower seat;Shower seat - built in      Prior Function Level of Independence: Independent with assistive device(s)   Gait / Transfers Assistance Needed: no AD used PTA 1 MA, no falls history.  ADL's / Homemaking Assistance Needed: independent, woreked on RV or in his shop  Comments: reports to have no difficulty with AMB PTA 1 MA     Hand Dominance   Dominant Hand: Right    Extremity/Trunk Assessment   Upper Extremity Assessment Upper Extremity Assessment: Generalized weakness    Lower Extremity Assessment Lower Extremity Assessment: Generalized weakness(grossly 4-/5 knee extension/flexion, hip horizontal ABDCT/add when tested.)       Communication      Cognition Arousal/Alertness: Awake/alert Behavior During Therapy: WFL for tasks assessed/performed Overall Cognitive Status: History of cognitive impairments - at baseline                                 General Comments: some difficulty with details in history      General Comments      Exercises     Assessment/Plan    PT Assessment Patient needs continued PT services  PT Problem List Decreased strength;Decreased range of motion;Decreased activity tolerance;Decreased balance;Decreased mobility;Pain;Decreased cognition;Decreased knowledge of use of DME;Decreased safety awareness       PT Treatment Interventions DME instruction;Gait training;Stair training;Functional mobility training;Therapeutic activities;Patient/family education;Balance training;Therapeutic exercise    PT Goals (Current goals can be found in the Care Plan section)  Acute Rehab PT Goals Patient Stated Goal: not have pain in his legs PT Goal Formulation: With patient Time For Goal Achievement: 07/09/19 Potential to Achieve Goals: Fair    Frequency Min 2X/week   Barriers to discharge Decreased caregiver support;Inaccessible  home environment wife works, pt requires +2 assist for in/out bed    Co-evaluation               AM-PAC PT "6 Clicks" Mobility  Outcome Measure Help needed turning from your back to your side while in a flat bed without using bedrails?: Total Help needed moving from lying on your back to sitting on the side of a flat bed without using bedrails?: Total Help needed moving to and from a bed to a chair (including a wheelchair)?: Total Help needed standing up from a chair using your arms (e.g., wheelchair or bedside chair)?: A Little Help needed to walk in hospital room?: A Little Help needed climbing 3-5 steps with a railing? : Total 6 Click Score: 10    End of Session   Activity Tolerance: Patient limited by fatigue;Patient limited by pain Patient left: in bed;with call bell/phone within reach Nurse Communication: Mobility status PT Visit Diagnosis: Unsteadiness on feet (R26.81);Other abnormalities of gait and mobility (R26.89);Repeated falls (R29.6);Muscle weakness (generalized) (M62.81);History of falling (Z91.81);Difficulty in walking, not elsewhere classified (R26.2)    Time: 7824-2353 PT Time Calculation (min) (ACUTE ONLY): 20 min   Charges:   PT Evaluation $PT Eval High Complexity: 1 High  4:40 PM, 06/25/19 Rosamaria LintsAllan C Cleatus Gabriel, PT, DPT Physical Therapist - [facility]  [phone] (ASCOM)   Melvin Stone C 06/25/2019, 4:34 PM

## 2019-06-25 NOTE — TOC Initial Note (Addendum)
Transition of Care [facility]) - Initial/Assessment Note    Patient Details  Name: Melvin Stone MRN: [id number] Date of Birth: 03-30-1961  Transition of Care [facility]) CM/SW Contact:    Anselm Pancoast, RN Phone Number: 06/25/2019, 2:03 PM  Clinical Narrative:                 58 year old brought in after recent discharge from [facility] where skilled nursing facility was recommended however patient decided to go home with family support. Returned to hospital with increased weakness and inability to manage. Requesting placement in SNF for rehab as wife works during the day and patient is home alone. PASSR completed and request to be sent out for requests.   Expected Discharge Plan: [place]     Patient Goals and CMS Choice Patient states their goals for this hospitalization and ongoing recovery are:: go to rehab and get strong enough to get home CMS Medicare.gov Compare Post Acute Care list provided to:: Patient Choice offered to / list presented to : Patient  Expected Discharge Plan and Services Expected Discharge Plan: [place] arrangements for the past 2 months: Single Family Home                                      Prior Living Arrangements/Services Living arrangements for the past 2 months: Single Family Home Lives with:: Spouse Patient language and need for interpreter reviewed:: Yes Do you feel safe going back to the place where you live?: Yes      Need for Family Participation in Patient Care: Yes (Comment) Care giver support system in place?: Yes (comment)   Criminal Activity/Legal Involvement Pertinent to Current Situation/Hospitalization: No - Comment as needed  Activities of Daily Living      Permission Sought/Granted Permission sought to share information with : Case Manager, Facility Sport and exercise psychologist, Family Supports Permission granted to share information with : Yes, Verbal Permission Granted  Share Information  with NAME: Bridgette Sharpe        Permission granted to share info w Contact Information: [phone]  Emotional Assessment Appearance:: Appears older than stated age Attitude/Demeanor/Rapport: Gracious Affect (typically observed): Accepting Orientation: : Oriented to Self, Oriented to Place, Oriented to  Time, Oriented to Situation Alcohol / Substance Use: Tobacco Use Psych Involvement: No (comment)  Admission diagnosis:  weakness Patient Active Problem List   Diagnosis Date Noted  . Ascites due to alcoholic cirrhosis ([place]) 19/50/9326  . Chest pain 12/16/2016   PCP:  Kirk Ruths, MD Pharmacy:   [facility], [place] [address] [place] [address] Phone: [phone] Fax: [phone]     Social Determinants of Health (SDOH) Interventions    Readmission Risk Interventions No flowsheet data found.

## 2019-06-25 NOTE — NC FL2 (Signed)
[place] MEDICAID FL2 LEVEL OF CARE SCREENING TOOL     IDENTIFICATION  Patient Name: Melvin Stone Birthdate: [birth year] Sex: male Admission Date (Current Location): 06/25/2019  [place] and [place] Number:  Chiropodist and Address:  [facility], [address], [place], [place] [address]      Provider Number: [id number]  Attending Physician Name and Address:  Emily Filbert, MD  Relative Name and Phone Number:  Witter,Bridgette   Spouse      [phone]    Current Level of Care: Hospital Recommended Level of Care: Skilled Nursing Facility Prior Approval Number:    Date Approved/Denied:   PASRR Number: [id number]  Discharge Plan: SNF    Current Diagnoses: Patient Active Problem List   Diagnosis Date Noted  . Ascites due to alcoholic cirrhosis ([facility]) 05/15/2019  . Chest pain 12/16/2016    Orientation RESPIRATION BLADDER Height & Weight     Self, Time, Situation, Place  Normal Continent Weight: 150 lb (68 kg) Height:  5\' 4"  (162.6 cm)  BEHAVIORAL SYMPTOMS/MOOD NEUROLOGICAL BOWEL NUTRITION STATUS      Continent Diet  AMBULATORY STATUS COMMUNICATION OF NEEDS Skin   Extensive Assist Verbally Normal                       Personal Care Assistance Level of Assistance  Bathing, Feeding, Dressing Bathing Assistance: Maximum assistance Feeding assistance: Independent Dressing Assistance: Maximum assistance     Functional Limitations Info  Sight, Hearing, Speech Sight Info: Adequate Hearing Info: Adequate Speech Info: Adequate    SPECIAL CARE FACTORS FREQUENCY                       Contractures Contractures Info: Not present    Additional Factors Info  Allergies, Code Status Code Status Info: FULL Allergies Info: No known allergies           Current Medications (06/25/2019):  This is the current hospital active medication list No current facility-administered medications for this encounter.     Current Outpatient Medications  Medication Sig Dispense Refill  . albumin human 5 % bottle Inject 500 mLs (25 g total) into the vein 2 (two) times daily. 250 mL 0  . fluticasone (FLONASE) 50 MCG/ACT nasal spray Place 2 sprays into both nostrils daily as needed.    . lactulose (CHRONULAC) 10 GM/15ML solution Take 15 mLs (10 g total) by mouth 2 (two) times daily. 236 mL 0  . levocetirizine (XYZAL) 5 MG tablet Take 5 mg by mouth daily.    . metoprolol tartrate (LOPRESSOR) 25 MG tablet Take 1 tablet by mouth 2 (two) times daily.    . midodrine (PROAMATINE) 10 MG tablet Take 1 tablet (10 mg total) by mouth 3 (three) times daily with meals.    . Multiple Vitamin (MULTIVITAMIN WITH MINERALS) TABS tablet Take 1 tablet by mouth daily.    13/04/2019 octreotide (SANDOSTATIN) 100 MCG/ML SOLN injection Inject 2 mLs (200 mcg total) into the skin 3 (three) times daily. 90 mL   . ondansetron (ZOFRAN) 8 MG tablet Take 1 tablet by mouth every 8 (eight) hours as needed.    . potassium chloride SA (KLOR-CON) 20 MEQ tablet Take 2 tablets (40 mEq total) by mouth once for 1 dose. 2 tablet 0  . prednisoLONE (ORAPRED) 15 MG/5ML solution Take 13.3 mLs (40 mg total) by mouth daily before breakfast. 100 mL 0  . rifaximin (XIFAXAN) 550 MG TABS tablet Take 1  tablet (550 mg total) by mouth 2 (two) times daily. 42 tablet      Discharge Medications: Please see discharge summary for a list of discharge medications.  Relevant Imaging Results:  Relevant Lab Results:   Additional Information SSN:  [id number]  Tania Sophira Rumler, LCSW

## 2019-06-25 NOTE — Social Work (Signed)
CSW faxed FL2 and PT evaluation to SNFs. Currently awaiting COVID results.    [facility], [place] ED  [phone]

## 2019-06-25 NOTE — ED Notes (Signed)
Pt assisted with urinal and repositioned in bed

## 2019-06-25 NOTE — ED Notes (Signed)
Pt informed that he can eat and will be staying here in ED for the night until social work comes to see him. Wife at bedside

## 2019-06-25 NOTE — ED Triage Notes (Signed)
Pt comes via [facility] with c/o weakness. Pt states he was just released last night from [facility] for kidney and liver failure. Pt states this weakness started last night.  EMS reports VSS. Pt also c/o bilateral hip pain. Pt is A&OX4  Pt is yellow in appearance.

## 2019-06-26 LAB — SARS CORONAVIRUS 2 (TAT 6-24 HRS): SARS Coronavirus 2: NEGATIVE

## 2019-06-26 MED ORDER — OXYCODONE HCL 5 MG PO TABS
5.0000 mg | ORAL_TABLET | Freq: Once | ORAL | Status: AC
  Administered 2019-06-26: 06:00:00 5 mg via ORAL
  Filled 2019-06-26: qty 1

## 2019-06-26 MED ORDER — CARVEDILOL 6.25 MG PO TABS
3.1250 mg | ORAL_TABLET | Freq: Two times a day (BID) | ORAL | Status: DC
  Administered 2019-06-26: 15:00:00 3.125 mg via ORAL
  Filled 2019-06-26: qty 1

## 2019-06-26 MED ORDER — LACTULOSE 10 GM/15ML PO SOLN
10.0000 g | Freq: Two times a day (BID) | ORAL | Status: DC
  Filled 2019-06-26: qty 30

## 2019-06-26 MED ORDER — RIFAXIMIN 550 MG PO TABS: 550.0000 mg | ORAL_TABLET | Freq: Two times a day (BID) | ORAL | Status: DC

## 2019-06-26 MED ORDER — SPIRONOLACTONE 25 MG PO TABS: 50.0000 mg | ORAL_TABLET | Freq: Every day | ORAL | Status: DC

## 2019-06-26 MED ORDER — ONDANSETRON HCL 4 MG PO TABS: 8.0000 mg | ORAL_TABLET | Freq: Three times a day (TID) | ORAL | Status: DC | PRN

## 2019-06-26 MED ORDER — FUROSEMIDE 40 MG PO TABS
20.0000 mg | ORAL_TABLET | Freq: Every day | ORAL | Status: DC
  Administered 2019-06-26: 15:00:00 20 mg via ORAL
  Filled 2019-06-26: qty 1

## 2019-06-26 MED ORDER — FOLIC ACID 1 MG PO TABS
1.0000 mg | ORAL_TABLET | Freq: Every day | ORAL | Status: DC
  Administered 2019-06-26: 15:00:00 1 mg via ORAL
  Filled 2019-06-26: qty 1

## 2019-06-26 MED ORDER — HYDROMORPHONE HCL 1 MG/ML IJ SOLN
1.0000 mg | Freq: Once | INTRAMUSCULAR | Status: AC
  Administered 2019-06-26: 07:00:00 1 mg via INTRAVENOUS
  Filled 2019-06-26: qty 1

## 2019-06-26 MED ORDER — MIDODRINE HCL 5 MG PO TABS: 10.0000 mg | ORAL_TABLET | Freq: Three times a day (TID) | ORAL | Status: DC

## 2019-06-26 MED ORDER — POTASSIUM CHLORIDE CRYS ER 20 MEQ PO TBCR
40.0000 meq | EXTENDED_RELEASE_TABLET | Freq: Once | ORAL | Status: AC
  Administered 2019-06-26: 15:00:00 40 meq via ORAL
  Filled 2019-06-26: qty 2

## 2019-06-26 NOTE — Social Work (Addendum)
CSW shared bed offers with patient, and he chose to go to [facility]. Claiborne Billings at [facility] was notified, and she shared that she will begin insurance authorization which may take up to 24 hours to complete.  Patient's RN notified.    [place], [place] ED  [phone]

## 2019-06-26 NOTE — ED Notes (Signed)
Pharmacy tech at bedside 

## 2019-06-26 NOTE — ED Notes (Signed)
PT resting.  

## 2019-06-26 NOTE — ED Notes (Signed)
PT called out to have water refilled. PT denies any other needs at this time. Trash from bedside table cleared.

## 2019-06-26 NOTE — ED Notes (Signed)
Report given to EMS

## 2019-06-26 NOTE — ED Notes (Signed)
This RN to bedside to answer call bell. Monitor was beeping, silenced alarm. Pt does not express any other needs.

## 2019-06-26 NOTE — TOC Transition Note (Signed)
Transition of Care [facility]) - CM/SW Discharge Note   Patient Details  Name: Melvin Stone MRN: [id number] Date of Birth: Jan 30, 1961  Transition of Care [facility]) CM/SW Contact:  Fredric Mare, LCSW Phone Number: 06/26/2019, 2:19 PM   Clinical Narrative:    Patient has been accepted to [facility]. They will be ready for him at 4:00pm.   Rm: 2B Patient's RN notified  EDP notified ED secretary notified for transport.    Final next level of care: [place]     Patient Goals and CMS Choice Patient states their goals for this hospitalization and ongoing recovery are:: go to rehab and get strong enough to get home CMS Medicare.gov Compare Post Acute Care list provided to:: Patient Choice offered to / list presented to : Patient  Discharge Placement              Patient chooses bed at: [facility] Patient to be transferred to facility by: EMS Name of family member notified: Bridgette Patient and family notified of of transfer: 06/26/19  Discharge Plan and Services                                     Social Determinants of Health (SDOH) Interventions     Readmission Risk Interventions No flowsheet data found.

## 2019-06-26 NOTE — ED Notes (Signed)
PT's bed adjusted multiple times. PT still c/o pain. MD made aware.  Educated pt that medication given PO is not instant.

## 2019-06-26 NOTE — Social Work (Addendum)
Patient's wife has concerns about patient going to [facility].  Claiborne Billings at [facility] will speak with patient's wife.   [facility], [facility], [facility] do not have beds available at this time.   12:00pm - Patient's wife agreed for patient to go to [facility].   [place], [place] ED  [phone]

## 2019-06-26 NOTE — ED Notes (Signed)
[facility] called  For transport

## 2019-06-26 NOTE — ED Notes (Signed)
Still waiting on transport to [facility]

## 2019-06-26 NOTE — ED Notes (Signed)
Pt provided breakfast tray; able to feed self. 

## 2019-06-26 NOTE — ED Notes (Signed)
PT resting with eyes closed. Respirations even and unlabored. VSS.

## 2019-06-26 NOTE — ED Notes (Signed)
PT adjusted in bed by another staff member. This rn to room to talk to pt again. PT states he is still in pain. States that he can tolerate pain but this pain has him crying. PT does not appear to be crying at this time. MD states she will put in new orders if continued pain. PT would like dilaudid.

## 2019-06-26 NOTE — ED Notes (Signed)
Fresh brief applied to pt and pt taken off monitor at his  Request. Condom cath removed at pt's request.

## 2019-07-17 DEATH — deceased

## 2020-04-25 IMAGING — US ULTRASOUND ABDOMEN LIMITED
1 series · 14 of 25 positions shown · non-contrast
Comparison: Abdominal ultrasound dated December 27, 2007.

CLINICAL DATA: Abdominal pain.

EXAM:
ULTRASOUND ABDOMEN LIMITED RIGHT UPPER QUADRANT

[Series 1: ultrasound abdomen limited · 14 of 58 slices shown]
[im 1/58]
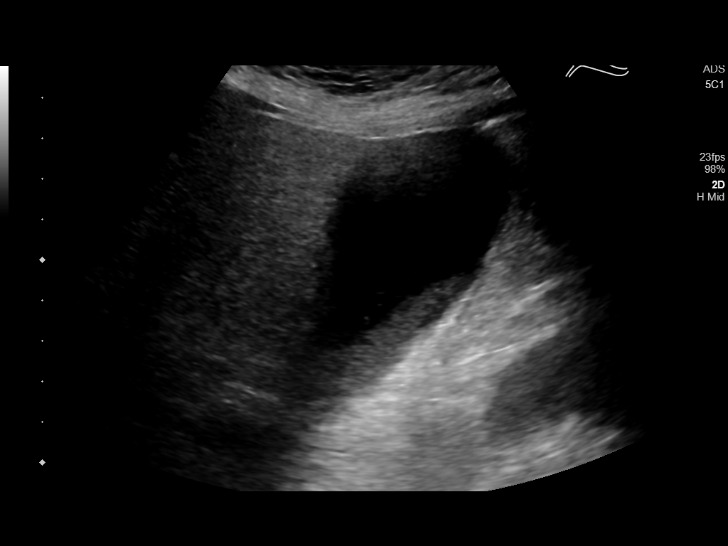
[im 5/58]
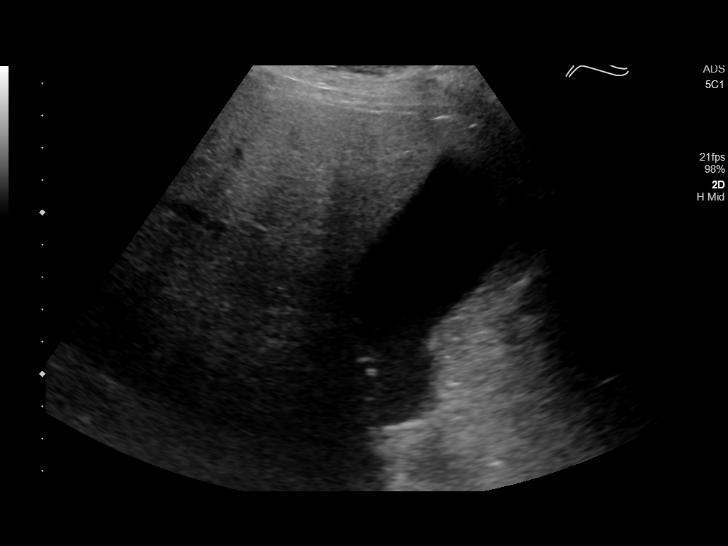
[im 10/58]
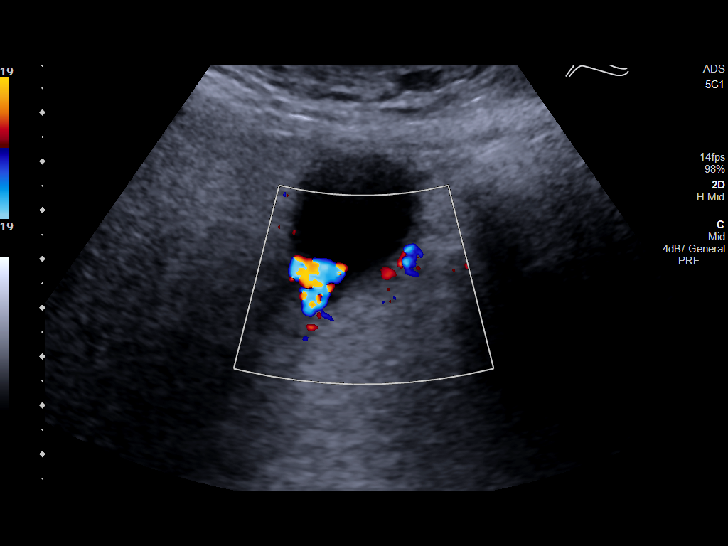
[im 15/58]
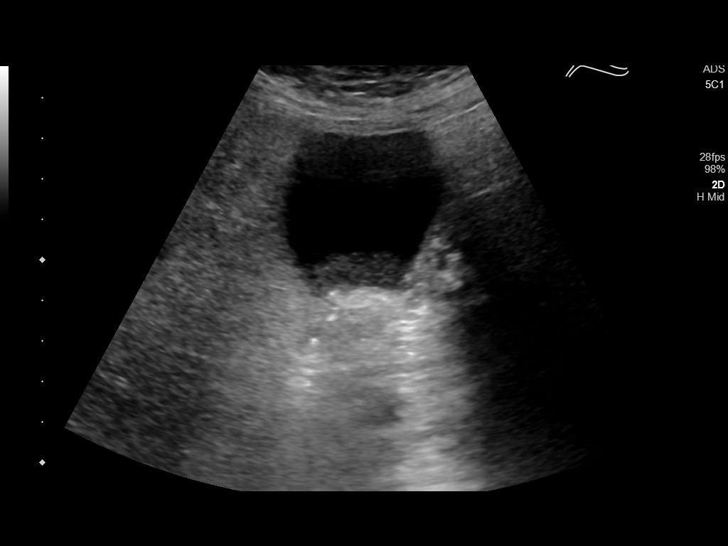
[im 20/58]
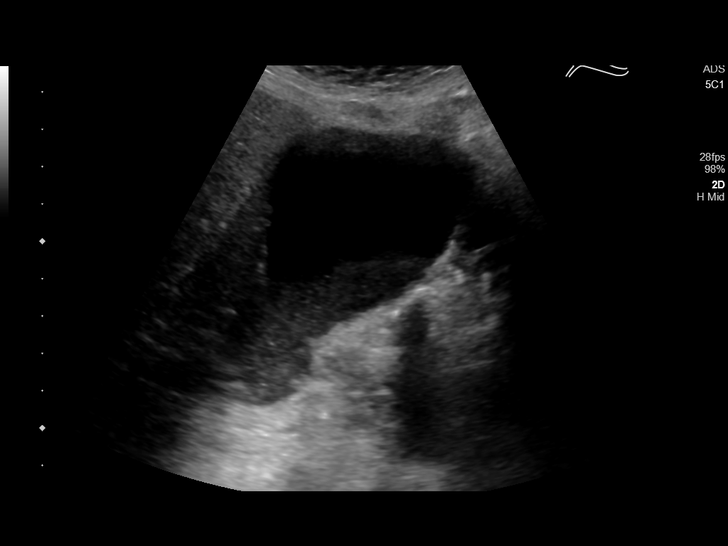
[im 22/58]
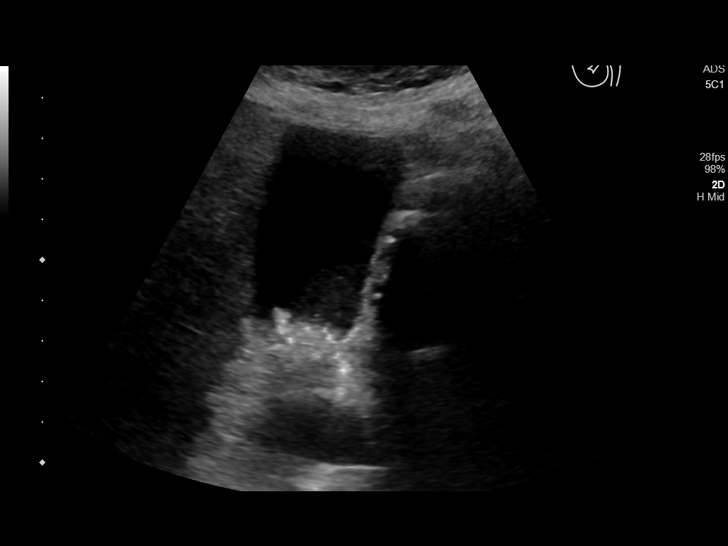
[im 27/58]
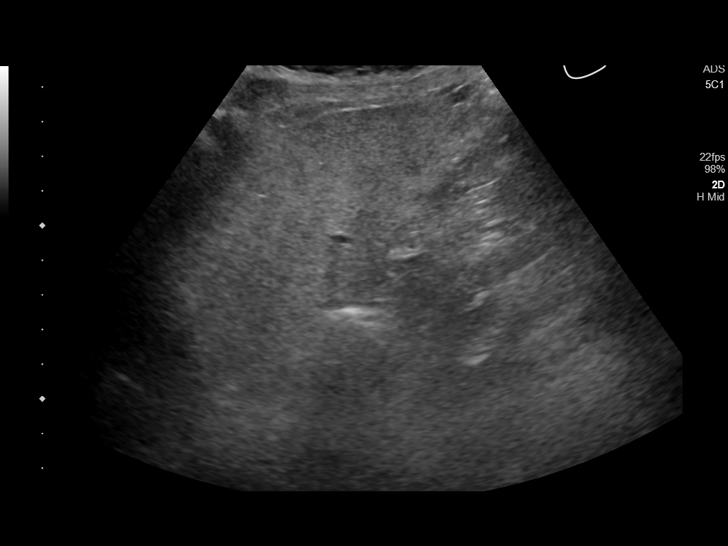
[im 31/58]
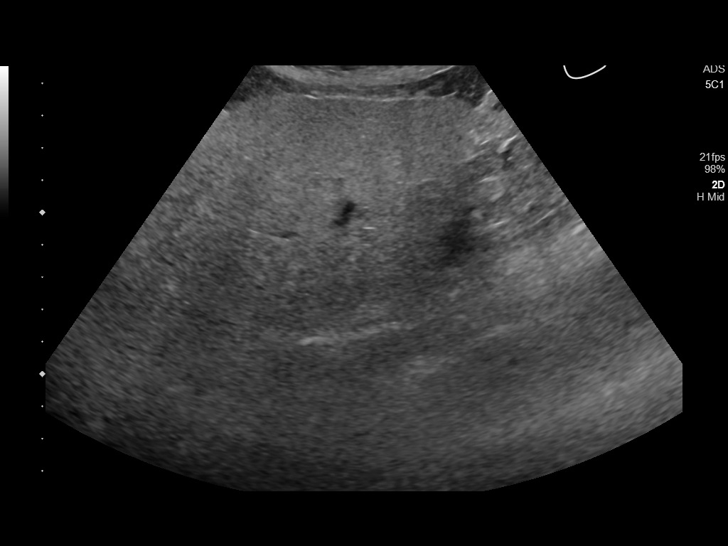
[im 36/58]
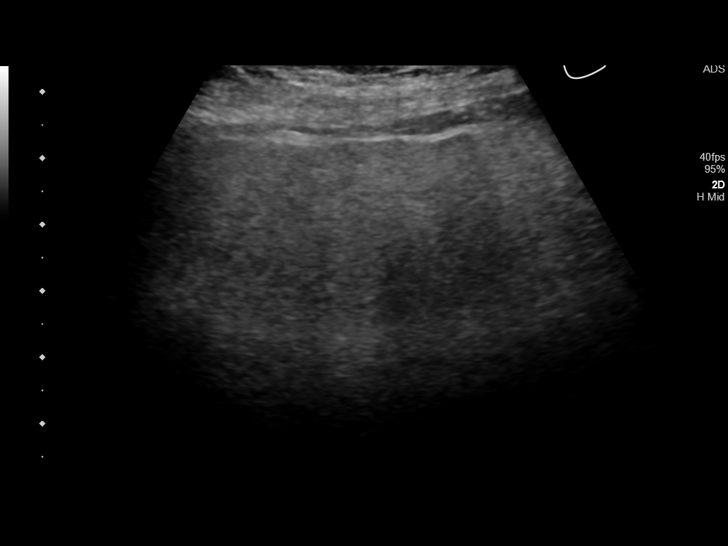
[im 39/58]
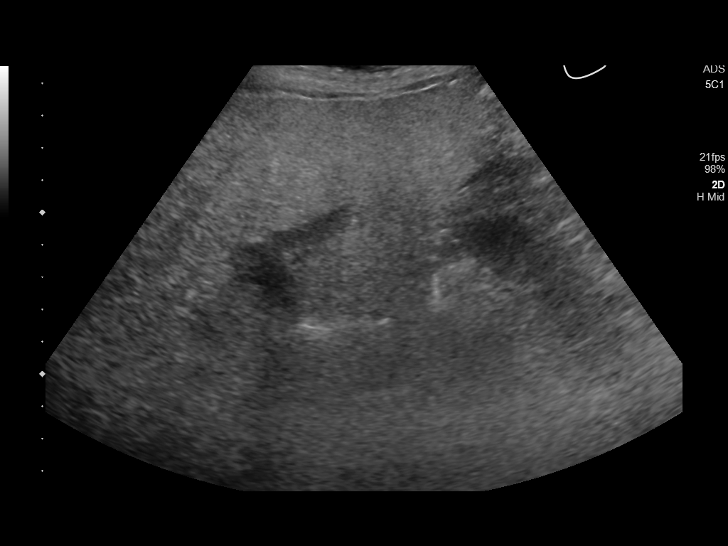
[im 43/58]
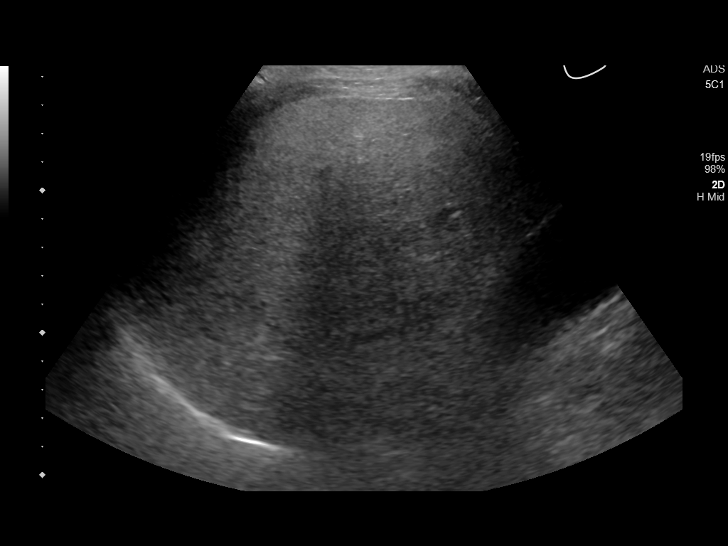
[im 48/58]
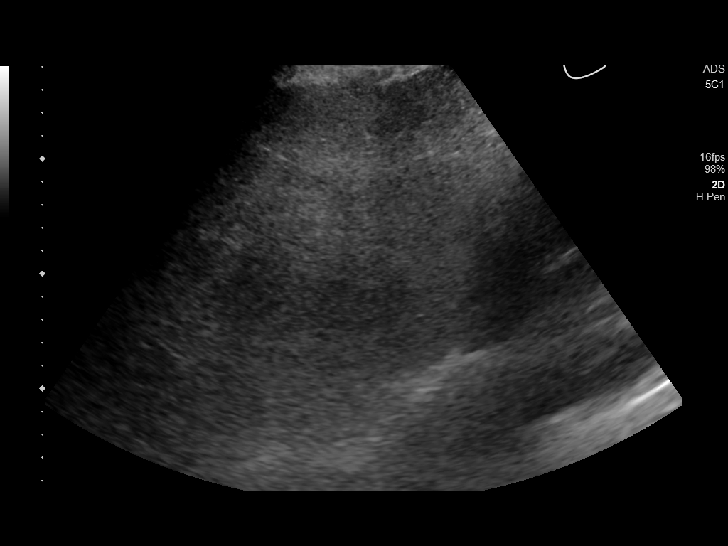
[im 53/58]
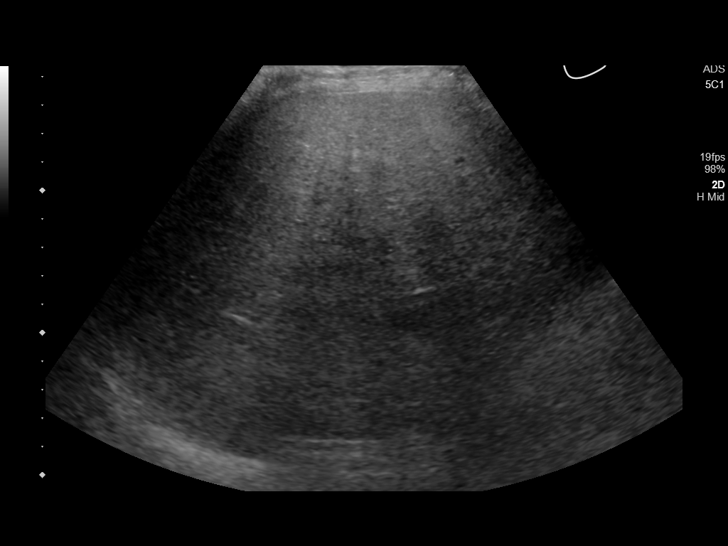
[im 58/58]
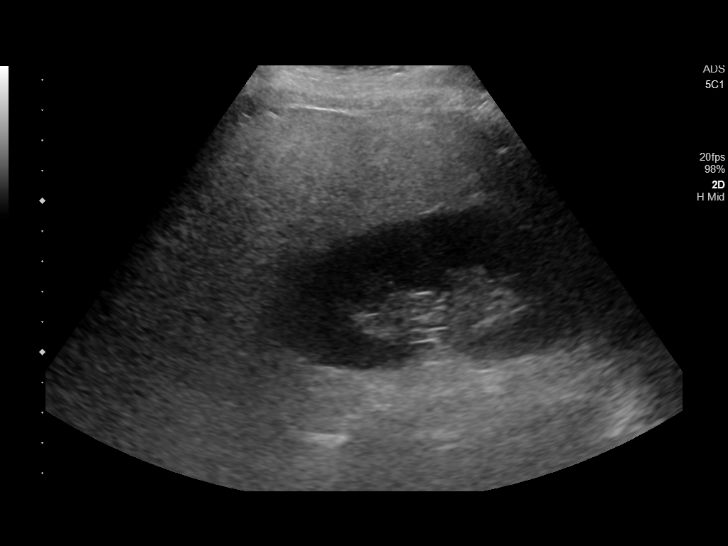

[14 of 25 positions shown; findings below may reference images not displayed]

FINDINGS: Gallbladder:

Sludge and small stones. No wall thickening visualized. No
sonographic Murphy sign noted by sonographer.

Common bile duct:

Diameter: 3 mm, normal.

Liver:

No focal lesion identified. Increased in parenchymal echogenicity.
Portal vein is patent on color Doppler imaging with normal direction
of blood flow towards the liver.
IMPRESSION: 1. Cholelithiasis and sludge without evidence of acute
cholecystitis.
2. Hepatic steatosis.

## 2020-08-08 IMAGING — US US ABDOMEN LIMITED
1 series · 9 of 9 positions shown · non-contrast
Comparison: Same day right upper quadrant ultrasound. Abdominal
ultrasound January 30, 2019

CLINICAL DATA: Abdominal distension

EXAM:
LIMITED ABDOMEN ULTRASOUND FOR ASCITES
TECHNIQUE: Limited ultrasound survey for ascites was performed in all four
abdominal quadrants.

[Series 1: us abdomen limited · 0.33mm/px · 9 of 9 slices shown]
[im 1/9]
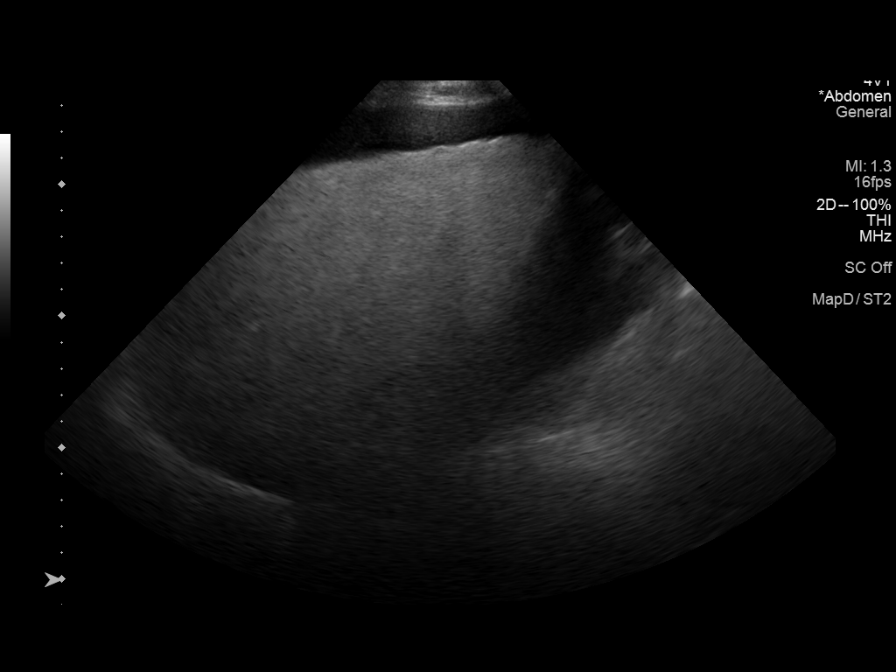
[im 2/9]
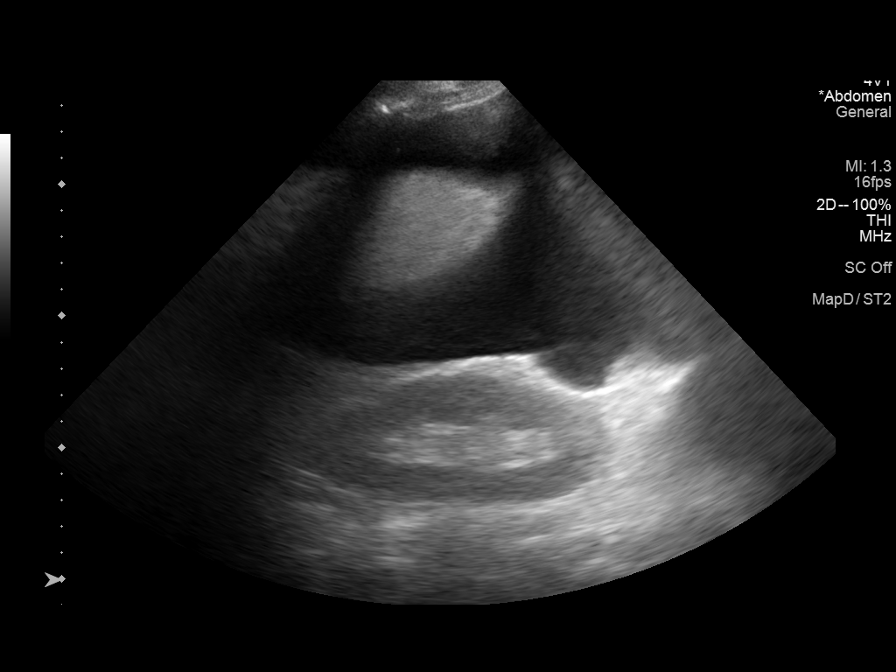
[im 3/9]
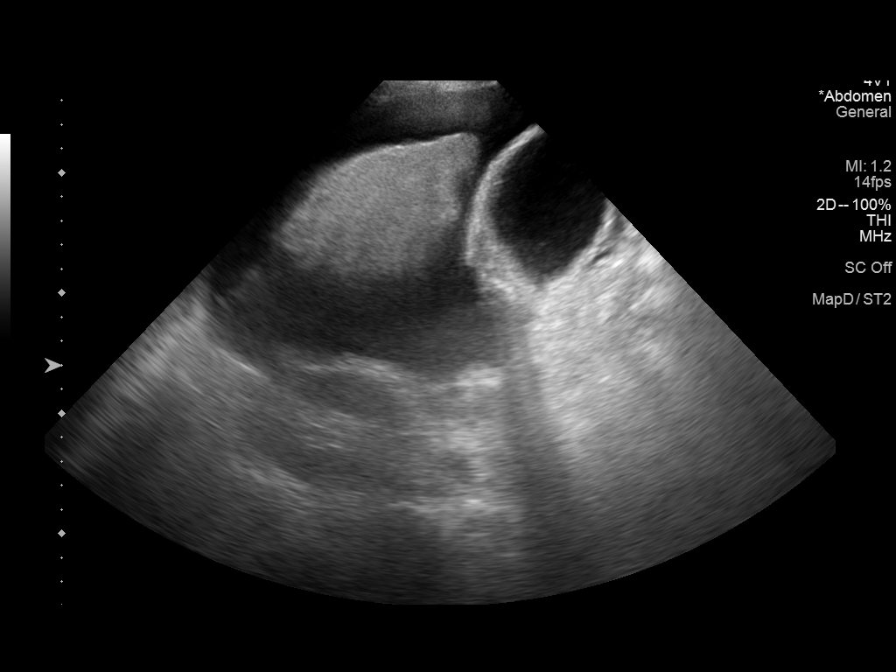
[im 4/9]
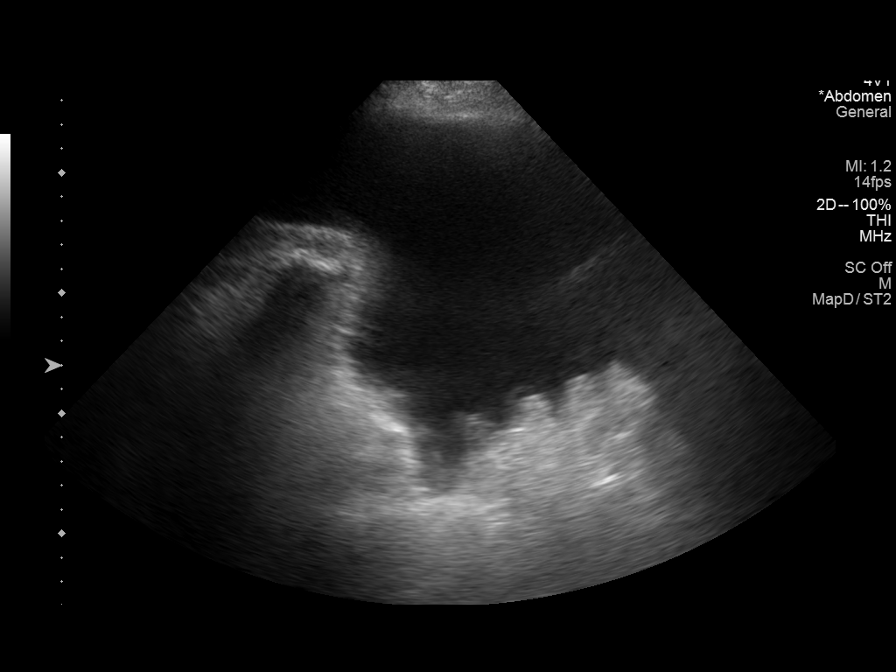
[im 5/9]
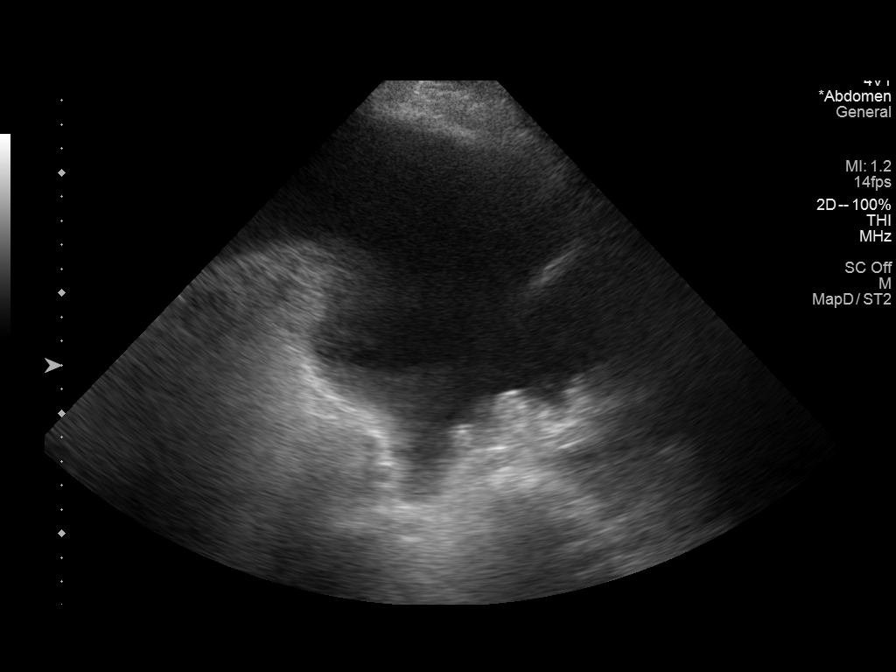
[im 6/9]
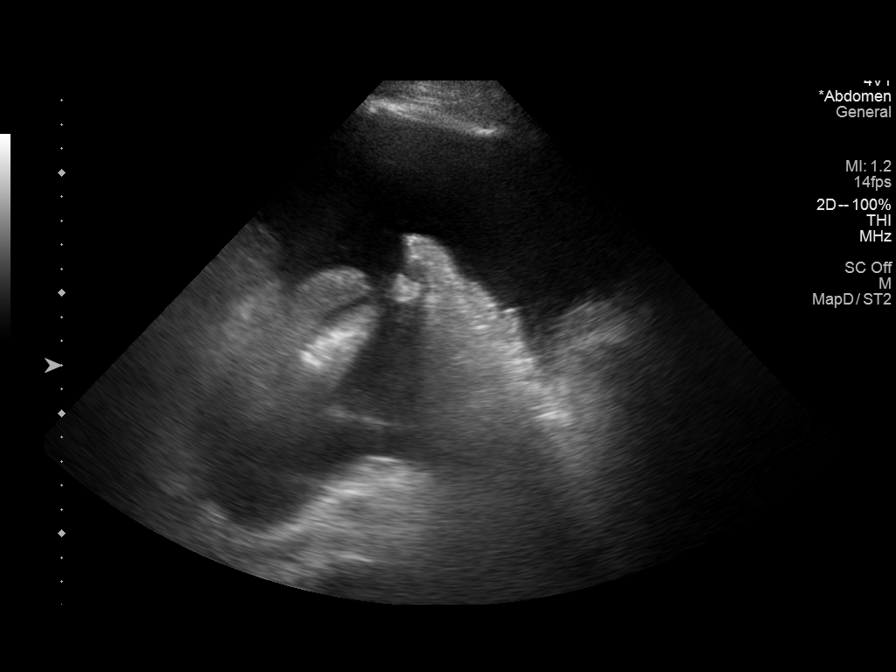
[im 7/9]
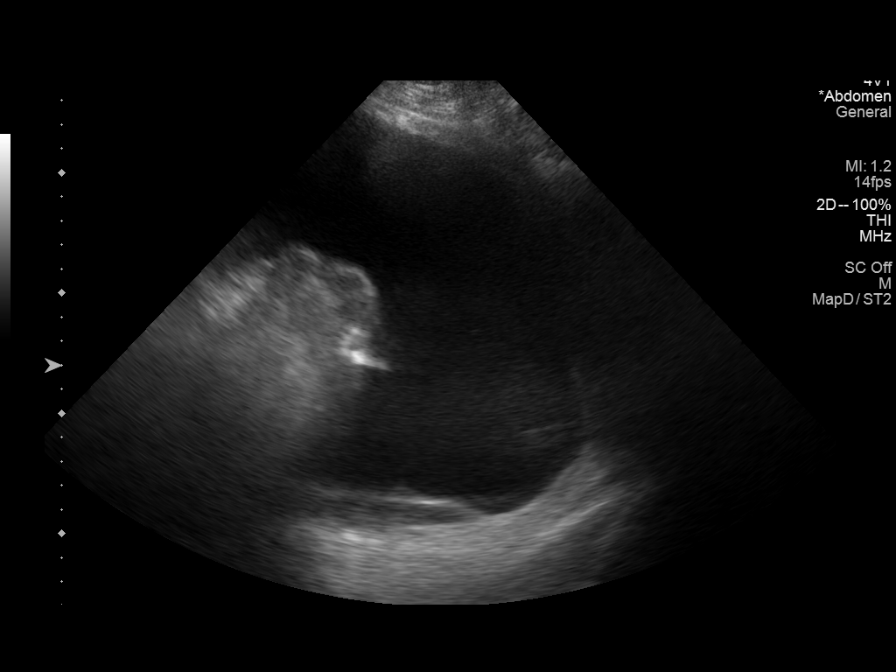
[im 8/9]
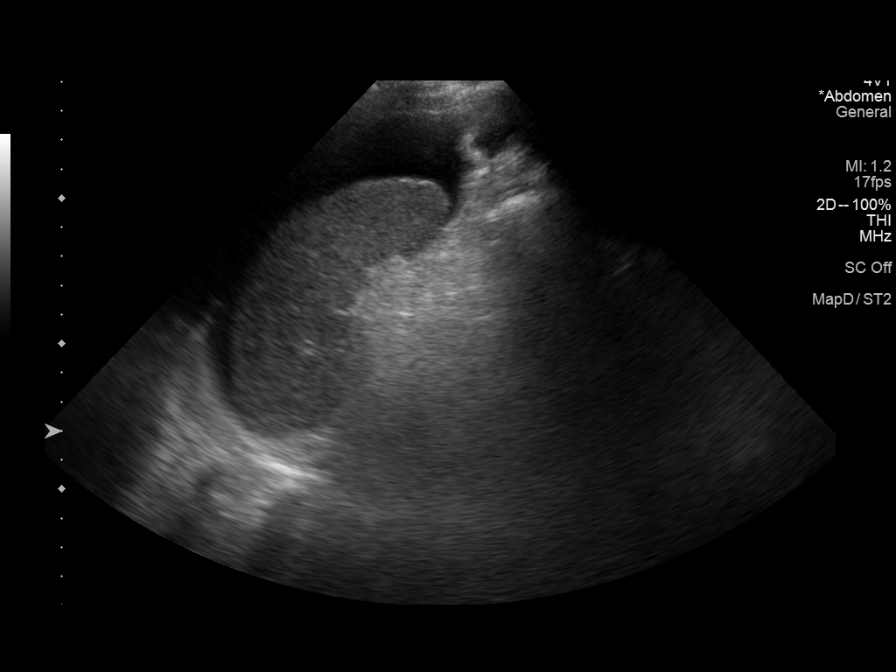
[im 9/9]
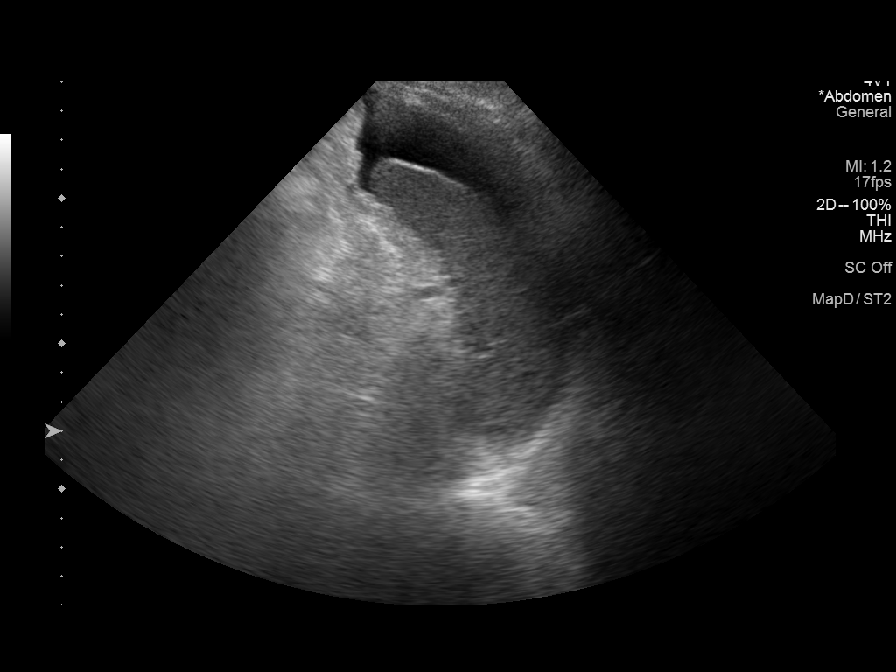

[9 of 9 positions shown; findings below may reference images not displayed]

FINDINGS: A moderate volume ascites is present in all 4 abdominal quadrants.
Nodular hepatic surface contour compatible with intrinsic liver
disease.
IMPRESSION: Moderate volume of abdominal ascites present in all 4 abdominal
quadrants.
